# Patient Record
Sex: Male | Born: 1975 | Race: Black or African American | Hispanic: No | Marital: Married | Smoking: Former smoker
Health system: Southern US, Community
[De-identification: ages and names within clinical notes are randomized; demographics above are authoritative.]

## PROBLEM LIST (undated history)

## (undated) DIAGNOSIS — E782 Mixed hyperlipidemia: Secondary | ICD-10-CM

## (undated) DIAGNOSIS — Z720 Tobacco use: Secondary | ICD-10-CM

## (undated) DIAGNOSIS — L729 Follicular cyst of the skin and subcutaneous tissue, unspecified: Secondary | ICD-10-CM

## (undated) DIAGNOSIS — M5412 Radiculopathy, cervical region: Secondary | ICD-10-CM

## (undated) DIAGNOSIS — Z98811 Dental restoration status: Secondary | ICD-10-CM

## (undated) HISTORY — DX: Radiculopathy, cervical region: M54.12

## (undated) HISTORY — PX: KNEE ARTHROSCOPY WITH PATELLAR TENDON REPAIR: SHX5656

## (undated) HISTORY — DX: Tobacco use: Z72.0

## (undated) HISTORY — PX: ACHILLES TENDON REPAIR: SUR1153

---
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
## 1998-03-19 ENCOUNTER — Emergency Department (HOSPITAL_COMMUNITY): Admission: EM | Admit: 1998-03-19 | Discharge: 1998-03-19 | Payer: Self-pay | Admitting: Emergency Medicine

## 2003-04-10 ENCOUNTER — Emergency Department (HOSPITAL_COMMUNITY): Admission: EM | Admit: 2003-04-10 | Discharge: 2003-04-11 | Payer: Self-pay | Admitting: Emergency Medicine

## 2003-04-10 ENCOUNTER — Encounter: Payer: Self-pay | Admitting: Emergency Medicine

## 2005-07-06 ENCOUNTER — Ambulatory Visit (HOSPITAL_COMMUNITY): Admission: RE | Admit: 2005-07-06 | Discharge: 2005-07-06 | Payer: Self-pay | Admitting: *Deleted

## 2008-03-20 ENCOUNTER — Emergency Department (HOSPITAL_COMMUNITY): Admission: EM | Admit: 2008-03-20 | Discharge: 2008-03-20 | Payer: Self-pay | Admitting: Emergency Medicine

## 2008-03-22 ENCOUNTER — Ambulatory Visit (HOSPITAL_COMMUNITY): Admission: RE | Admit: 2008-03-22 | Discharge: 2008-03-22 | Payer: Self-pay | Admitting: Emergency Medicine

## 2010-11-30 ENCOUNTER — Emergency Department (HOSPITAL_BASED_OUTPATIENT_CLINIC_OR_DEPARTMENT_OTHER)
Admission: EM | Admit: 2010-11-30 | Discharge: 2010-11-30 | Disposition: A | Discharge status: Home / Self Care | Payer: Managed Care, Other (non HMO) | Attending: Emergency Medicine | Admitting: Emergency Medicine

## 2010-11-30 ENCOUNTER — Emergency Department (INDEPENDENT_AMBULATORY_CARE_PROVIDER_SITE_OTHER): Payer: Managed Care, Other (non HMO)

## 2010-11-30 DIAGNOSIS — X500XXA Overexertion from strenuous movement or load, initial encounter: Secondary | ICD-10-CM

## 2010-11-30 DIAGNOSIS — F17200 Nicotine dependence, unspecified, uncomplicated: Secondary | ICD-10-CM | POA: Insufficient documentation

## 2010-11-30 DIAGNOSIS — Y9367 Activity, basketball: Secondary | ICD-10-CM

## 2010-11-30 DIAGNOSIS — S86819A Strain of other muscle(s) and tendon(s) at lower leg level, unspecified leg, initial encounter: Secondary | ICD-10-CM

## 2010-11-30 DIAGNOSIS — M66269 Spontaneous rupture of extensor tendons, unspecified lower leg: Secondary | ICD-10-CM | POA: Insufficient documentation

## 2011-07-01 ENCOUNTER — Ambulatory Visit (INDEPENDENT_AMBULATORY_CARE_PROVIDER_SITE_OTHER): Payer: Managed Care, Other (non HMO)

## 2011-07-01 DIAGNOSIS — R5381 Other malaise: Secondary | ICD-10-CM

## 2011-07-01 DIAGNOSIS — J111 Influenza due to unidentified influenza virus with other respiratory manifestations: Secondary | ICD-10-CM

## 2011-07-01 DIAGNOSIS — R05 Cough: Secondary | ICD-10-CM

## 2011-07-01 DIAGNOSIS — R509 Fever, unspecified: Secondary | ICD-10-CM

## 2012-02-06 IMAGING — CR DG KNEE COMPLETE 4+V*R*
4 series · 4 of 4 positions shown · non-contrast
Comparison: None.

CLINICAL DATA: Injury playing basketball

RIGHT KNEE - COMPLETE 4+ VIEW

[t knee ap right]
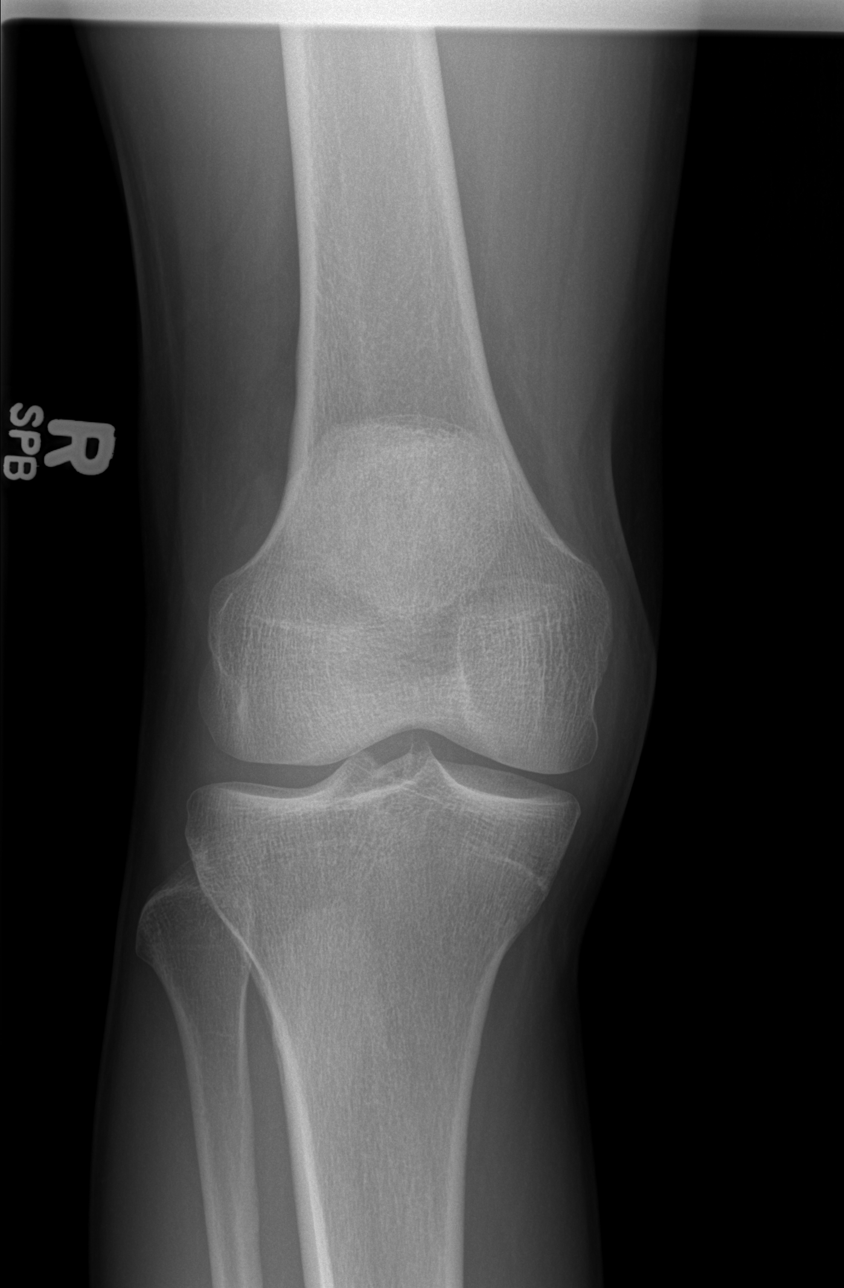

[t knee oblique right (1 of 2)]
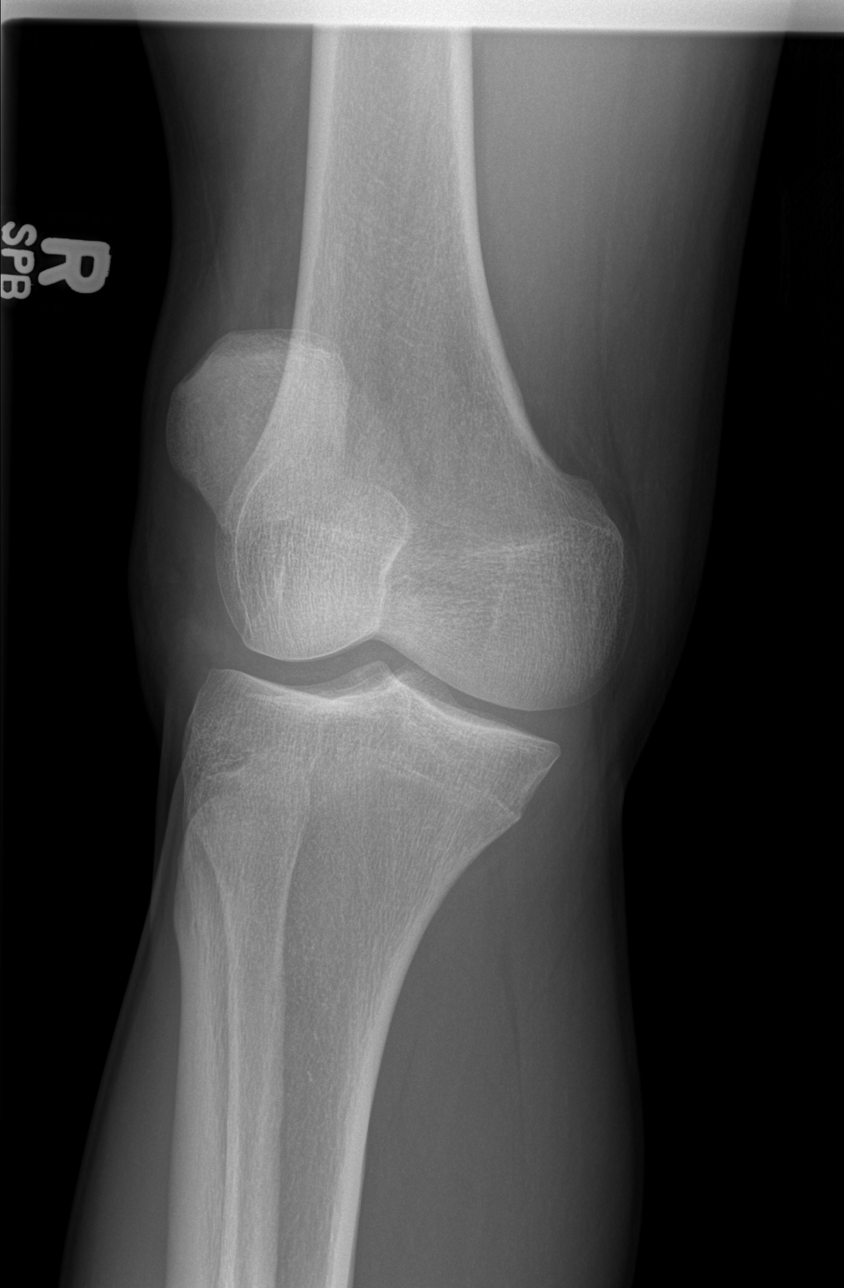

[t knee oblique right (2 of 2)]
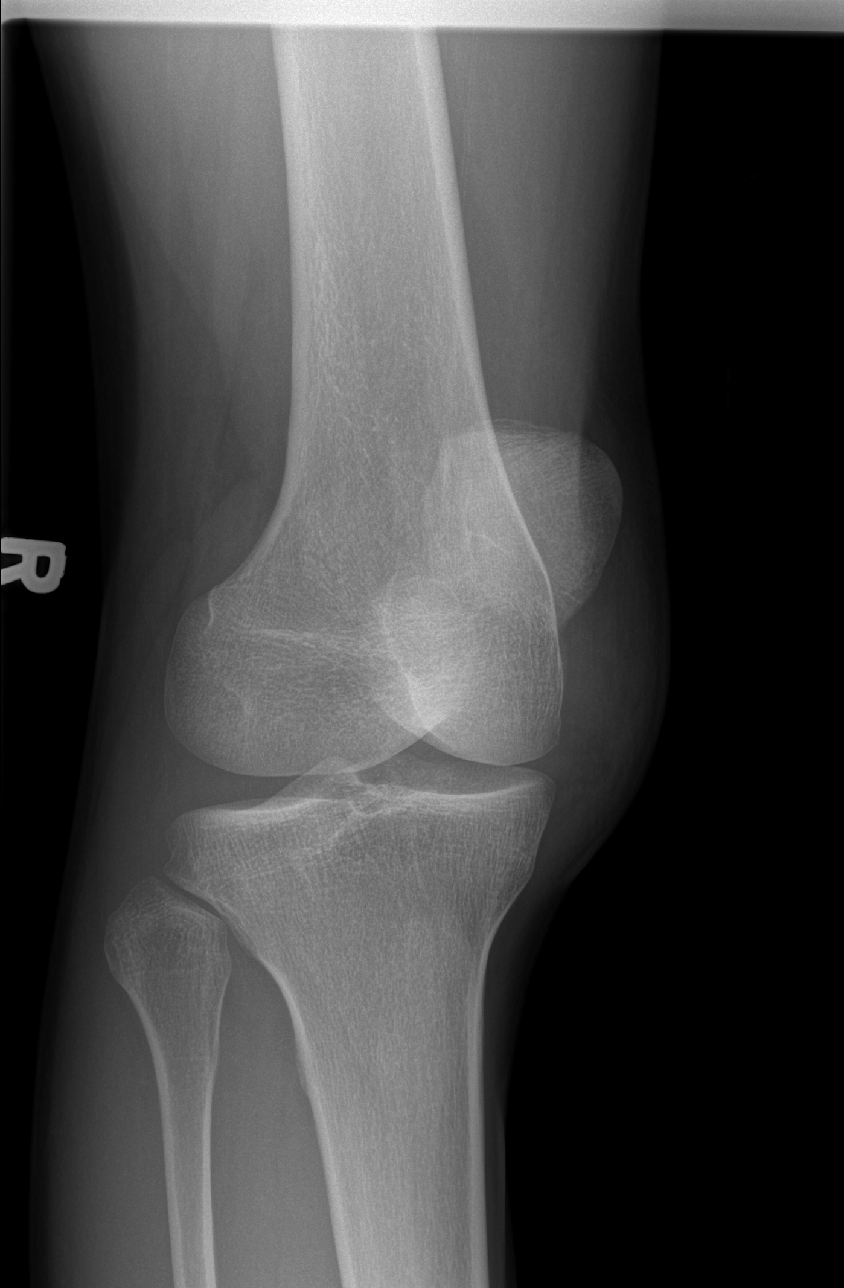

[t knee lat right]
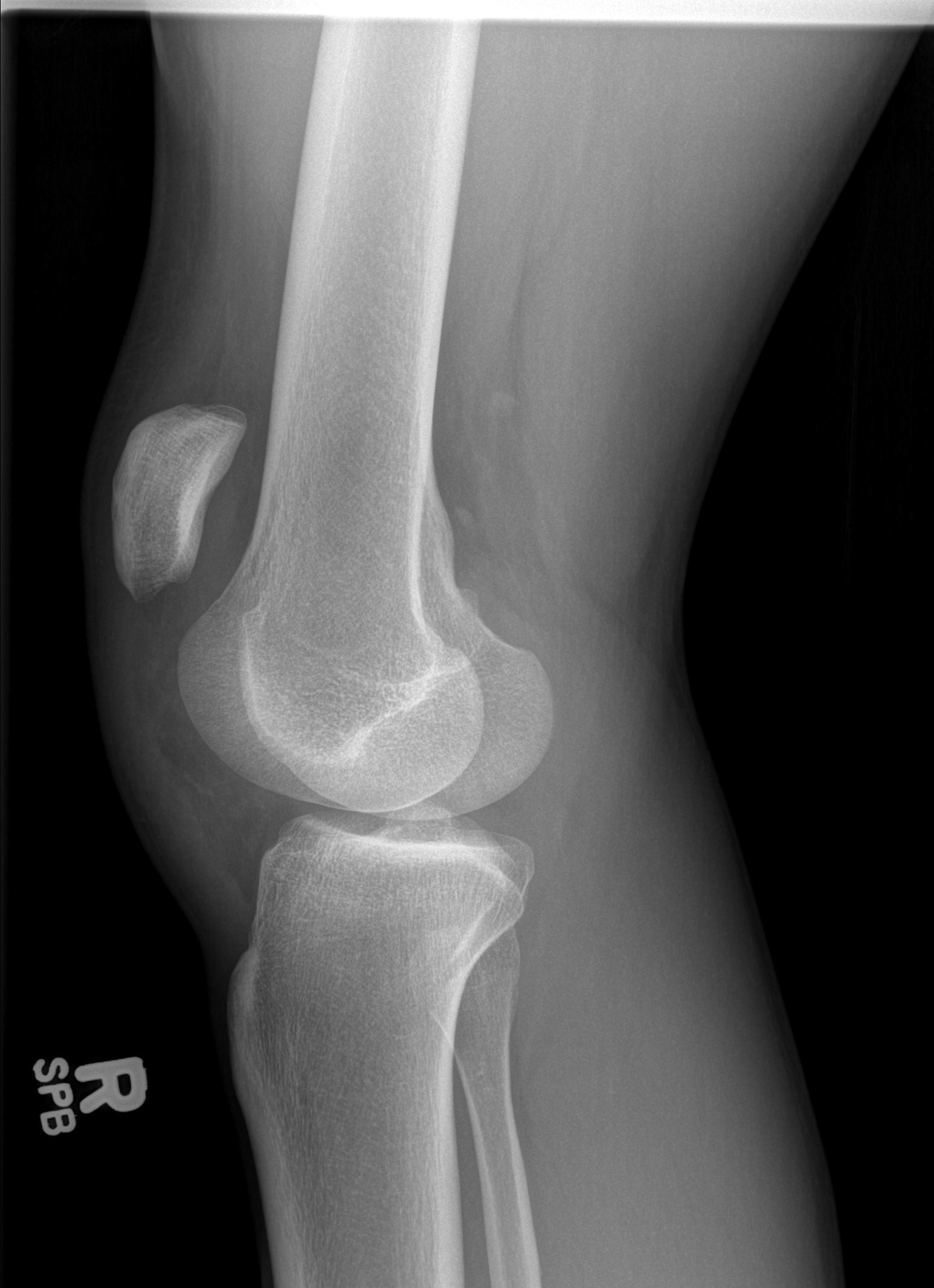

[4 of 4 positions shown; findings below may reference images not displayed]

FINDINGS: High riding patella compatible patella alta.  This
indicates  patellar tendon rupture.

Negative for fracture.
IMPRESSION: Patellar tendon rupture without fracture.

## 2015-12-28 ENCOUNTER — Other Ambulatory Visit: Payer: Self-pay | Admitting: Surgery

## 2016-01-12 DIAGNOSIS — L729 Follicular cyst of the skin and subcutaneous tissue, unspecified: Secondary | ICD-10-CM

## 2016-01-12 HISTORY — DX: Follicular cyst of the skin and subcutaneous tissue, unspecified: L72.9

## 2016-01-18 ENCOUNTER — Encounter (HOSPITAL_BASED_OUTPATIENT_CLINIC_OR_DEPARTMENT_OTHER): Payer: Self-pay | Admitting: *Deleted

## 2016-01-23 NOTE — H&P (Signed)
Derek Brewer 12/28/2015 9:15 AM Location: [facility] Patient #: [id number] DOB: [birth year] Married / Language: English / Race: Black or African American Male   History of Present Illness (Jaydah Stahle A. Ninfa Linden MD; 12/28/2015 9:27 AM) The patient is a 40 year old male who presents with skin lesions. This is a pleasant gentleman referred by Dr. 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 He reports it appeared about a year ago. It is intimately become infected and will spontaneously drain on its own. He recently completed a course of antibiotics for infection in the area. He is now pain-free reports that the area has regressed again. He is otherwise healthy without complaints.   Other Problems Elbert Ewings, CMA; 12/28/2015 9:16 AM) No pertinent past medical history  Past Surgical History Elbert Ewings, CMA; 12/28/2015 9:16 AM) Knee Surgery Right.  Diagnostic Studies History Elbert Ewings, [place]; 12/28/2015 9:16 AM) Colonoscopy never  Allergies Elbert Ewings, CMA; 12/28/2015 9:16 AM) No Known Drug Allergies06/16/2017  Medication History Elbert Ewings, CMA; 12/28/2015 9:16 AM) Multiple Vitamin (Oral) Active. Medications Reconciled  Social History Elbert Ewings, [place]; 12/28/2015 9:16 AM) Alcohol use Occasional alcohol use. Caffeine use Carbonated beverages, Coffee, Tea. No drug use Tobacco use Former smoker.  Family History Elbert Ewings, [place]; 12/28/2015 9:16 AM) Family history unknown First Degree Relatives    Review of Systems Elbert Ewings CMA; 12/28/2015 9:16 AM) General Not Present- Appetite Loss, Chills, Fatigue, Fever, Night Sweats, Weight Gain and Weight Loss. Skin Not Present- Change in Wart/Mole, Dryness, Hives, Jaundice, New Lesions, Non-Healing Wounds, Rash and Ulcer. HEENT Present- Seasonal Allergies. Not Present- Earache, Hearing Loss, Hoarseness, Nose Bleed, Oral Ulcers, Ringing in the Ears, Sinus Pain, Sore  Throat, Visual Disturbances, Wears glasses/contact lenses and Yellow Eyes. Respiratory Not Present- Bloody sputum, Chronic Cough, Difficulty Breathing, Snoring and Wheezing. Breast Not Present- Breast Mass, Breast Pain, Nipple Discharge and Skin Changes. Cardiovascular Not Present- Chest Pain, Difficulty Breathing Lying Down, Leg Cramps, Palpitations, Rapid Heart Rate, Shortness of Breath and Swelling of Extremities. Gastrointestinal Not Present- Abdominal Pain, Bloating, Bloody Stool, Change in Bowel Habits, Chronic diarrhea, Constipation, Difficulty Swallowing, Excessive gas, Gets full quickly at meals, Hemorrhoids, Indigestion, Nausea, Rectal Pain and Vomiting. Male Genitourinary Not Present- Blood in Urine, Change in Urinary Stream, Frequency, Impotence, Nocturia, Painful Urination, Urgency and Urine Leakage. Musculoskeletal Not Present- Back Pain, Joint Pain, Joint Stiffness, Muscle Pain, Muscle Weakness and Swelling of Extremities. Neurological Not Present- Decreased Memory, Fainting, Headaches, Numbness, Seizures, Tingling, Tremor, Trouble walking and Weakness. Psychiatric Not Present- Anxiety, Bipolar, Change in Sleep Pattern, Depression, Fearful and Frequent crying. Endocrine Not Present- Cold Intolerance, Excessive Hunger, Hair Changes, Heat Intolerance, Hot flashes and New Diabetes. Hematology Not Present- Easy Bruising, Excessive bleeding, Gland problems, HIV and Persistent Infections.  Vitals Elbert Ewings CMA; 12/28/2015 9:17 AM) 12/28/2015 9:16 AM Weight: 175 lb Height: 71in Body Surface Area: 1.99 m Body Mass Index: 24.41 kg/m  Temp.: 98.29F(Temporal)  Pulse: 88 (Regular)  BP: 140/72 (Sitting, Left Arm, Standard)   Physical Exam (Dayon Witt A. Ninfa Linden MD; 12/28/2015 9:28 AM) The physical exam findings are as follows: Note:On exam he is well-appearing Lungs are clear bilaterally Cardiovascular is regular rate and rhythm There is a 1 cm cyst on the left buttock  lateral to the gluteal cleft. It is currently nontender.    Assessment & Plan (Enijah Furr A. Ninfa Linden MD; 12/28/2015 9:29 AM) INFECTED SEBACEOUS CYST (L72.3) Impression: I explained the diagnosis to him in detail. Excision of this area  is recommended to both prevent continued infections and to rule out malignancy. I discussed the surgical procedure with him in detail. I discussed the risk of surgery which includes but is not limited to bleeding, infection, recurrence, etc. I will send more antibiotics to his pharmacy just in case he develops another infection prior to surgical excision. He agrees to proceed with surgery    Signed by Harl Bowie, MD (12/28/2015 9:29 AM)

## 2016-01-24 ENCOUNTER — Encounter (HOSPITAL_BASED_OUTPATIENT_CLINIC_OR_DEPARTMENT_OTHER)
Admission: RE | Disposition: A | Discharge status: Home / Self Care | Payer: Self-pay | Source: Ambulatory Visit | Attending: Surgery

## 2016-01-24 ENCOUNTER — Ambulatory Visit (HOSPITAL_BASED_OUTPATIENT_CLINIC_OR_DEPARTMENT_OTHER): Payer: Managed Care, Other (non HMO) | Admitting: Certified Registered"

## 2016-01-24 ENCOUNTER — Encounter (HOSPITAL_BASED_OUTPATIENT_CLINIC_OR_DEPARTMENT_OTHER): Payer: Self-pay | Admitting: Certified Registered"

## 2016-01-24 ENCOUNTER — Ambulatory Visit (HOSPITAL_BASED_OUTPATIENT_CLINIC_OR_DEPARTMENT_OTHER)
Admission: RE | Admit: 2016-01-24 | Discharge: 2016-01-24 | Disposition: A | Discharge status: Home / Self Care | Payer: Managed Care, Other (non HMO) | Source: Ambulatory Visit | Attending: Surgery | Admitting: Surgery

## 2016-01-24 DIAGNOSIS — D367 Benign neoplasm of other specified sites: Secondary | ICD-10-CM | POA: Diagnosis present

## 2016-01-24 DIAGNOSIS — Z87891 Personal history of nicotine dependence: Secondary | ICD-10-CM | POA: Insufficient documentation

## 2016-01-24 HISTORY — PX: CYST EXCISION: SHX5701

## 2016-01-24 HISTORY — DX: Follicular cyst of the skin and subcutaneous tissue, unspecified: L72.9

## 2016-01-24 HISTORY — DX: Dental restoration status: Z98.811

## 2016-01-24 SURGERY — CYST REMOVAL
Anesthesia: Monitor Anesthesia Care | Site: Buttocks | Laterality: Left

## 2016-01-24 MED ORDER — PROPOFOL 500 MG/50ML IV EMUL
INTRAVENOUS | Status: DC | PRN
  Administered 2016-01-24: 30 ug via INTRAVENOUS
  Administered 2016-01-24: 20 ug via INTRAVENOUS
  Administered 2016-01-24: 10 ug via INTRAVENOUS
  Administered 2016-01-24: 20 ug via INTRAVENOUS

## 2016-01-24 MED ORDER — OXYCODONE HCL 5 MG PO TABS: 5.0000 mg | ORAL_TABLET | Freq: Once | ORAL | Status: DC | PRN

## 2016-01-24 MED ORDER — FENTANYL CITRATE (PF) 100 MCG/2ML IJ SOLN: 25.0000 ug | INTRAMUSCULAR | Status: DC | PRN

## 2016-01-24 MED ORDER — CEFAZOLIN SODIUM-DEXTROSE 2-4 GM/100ML-% IV SOLN
2.0000 g | INTRAVENOUS | Status: AC
  Administered 2016-01-24: 2 g via INTRAVENOUS

## 2016-01-24 MED ORDER — SODIUM BICARBONATE 4 % IV SOLN
INTRAVENOUS | Status: AC
  Filled 2016-01-24: qty 5

## 2016-01-24 MED ORDER — ONDANSETRON HCL 4 MG/2ML IJ SOLN
INTRAMUSCULAR | Status: AC
  Filled 2016-01-24: qty 2

## 2016-01-24 MED ORDER — PROPOFOL 500 MG/50ML IV EMUL
INTRAVENOUS | Status: AC
  Filled 2016-01-24: qty 50

## 2016-01-24 MED ORDER — LIDOCAINE HCL (PF) 1 % IJ SOLN
INTRAMUSCULAR | Status: DC | PRN
  Administered 2016-01-24: 20 mL

## 2016-01-24 MED ORDER — MIDAZOLAM HCL 2 MG/2ML IJ SOLN
1.0000 mg | INTRAMUSCULAR | Status: DC | PRN
Start: 2016-01-24 — End: 2016-01-24
  Administered 2016-01-24: 2 mg via INTRAVENOUS

## 2016-01-24 MED ORDER — OXYCODONE-ACETAMINOPHEN 5-325 MG PO TABS: 1.0000 | ORAL_TABLET | ORAL | Status: DC | PRN

## 2016-01-24 MED ORDER — OXYCODONE HCL 5 MG/5ML PO SOLN: 5.0000 mg | Freq: Once | ORAL | Status: DC | PRN

## 2016-01-24 MED ORDER — ONDANSETRON HCL 4 MG/2ML IJ SOLN
INTRAMUSCULAR | Status: DC | PRN
  Administered 2016-01-24: 4 mg via INTRAVENOUS

## 2016-01-24 MED ORDER — FENTANYL CITRATE (PF) 100 MCG/2ML IJ SOLN
50.0000 ug | INTRAMUSCULAR | Status: DC | PRN
  Administered 2016-01-24 (×2): 50 ug via INTRAVENOUS

## 2016-01-24 MED ORDER — GLYCOPYRROLATE 0.2 MG/ML IJ SOLN: 0.2000 mg | Freq: Once | INTRAMUSCULAR | Status: DC | PRN

## 2016-01-24 MED ORDER — SCOPOLAMINE 1 MG/3DAYS TD PT72: 1.0000 | MEDICATED_PATCH | Freq: Once | TRANSDERMAL | Status: DC | PRN

## 2016-01-24 MED ORDER — ONDANSETRON HCL 4 MG/2ML IJ SOLN: 4.0000 mg | Freq: Four times a day (QID) | INTRAMUSCULAR | Status: DC | PRN

## 2016-01-24 MED ORDER — LIDOCAINE HCL (PF) 1 % IJ SOLN
INTRAMUSCULAR | Status: AC
  Filled 2016-01-24: qty 30

## 2016-01-24 MED ORDER — MIDAZOLAM HCL 2 MG/2ML IJ SOLN
INTRAMUSCULAR | Status: AC
  Filled 2016-01-24: qty 2

## 2016-01-24 MED ORDER — LIDOCAINE HCL (CARDIAC) 20 MG/ML IV SOLN
INTRAVENOUS | Status: DC | PRN
  Administered 2016-01-24: 20 mg via INTRAVENOUS

## 2016-01-24 MED ORDER — LIDOCAINE 2% (20 MG/ML) 5 ML SYRINGE
INTRAMUSCULAR | Status: AC
  Filled 2016-01-24: qty 5

## 2016-01-24 MED ORDER — FENTANYL CITRATE (PF) 100 MCG/2ML IJ SOLN
INTRAMUSCULAR | Status: AC
  Filled 2016-01-24: qty 2

## 2016-01-24 MED ORDER — BUPIVACAINE-EPINEPHRINE (PF) 0.5% -1:200000 IJ SOLN
INTRAMUSCULAR | Status: AC
  Filled 2016-01-24: qty 30

## 2016-01-24 MED ORDER — CEFAZOLIN SODIUM-DEXTROSE 2-4 GM/100ML-% IV SOLN
INTRAVENOUS | Status: AC
  Filled 2016-01-24: qty 100

## 2016-01-24 MED ORDER — LACTATED RINGERS IV SOLN
INTRAVENOUS | Status: DC
  Administered 2016-01-24: 10:00:00 via INTRAVENOUS

## 2016-01-24 SURGICAL SUPPLY — 40 items
BLADE HEX COATED 2.75 (ELECTRODE) ×3 IMPLANT
BLADE SURG 15 STRL LF DISP TIS (BLADE) ×1 IMPLANT
BLADE SURG 15 STRL SS (BLADE) ×2
CANISTER SUCT 1200ML W/VALVE (MISCELLANEOUS) IMPLANT
CHLORAPREP W/TINT 26ML (MISCELLANEOUS) IMPLANT
COVER BACK TABLE 60X90IN (DRAPES) ×3 IMPLANT
COVER MAYO STAND STRL (DRAPES) ×3 IMPLANT
DECANTER SPIKE VIAL GLASS SM (MISCELLANEOUS) IMPLANT
DRAPE LAPAROTOMY 100X72 PEDS (DRAPES) ×3 IMPLANT
DRAPE UTILITY XL STRL (DRAPES) ×3 IMPLANT
ELECT REM PT RETURN 9FT ADLT (ELECTROSURGICAL) ×3
ELECTRODE REM PT RTRN 9FT ADLT (ELECTROSURGICAL) ×1 IMPLANT
GLOVE BIO SURGEON STRL SZ7 (GLOVE) ×3 IMPLANT
GLOVE BIOGEL PI IND STRL 7.5 (GLOVE) ×1 IMPLANT
GLOVE BIOGEL PI INDICATOR 7.5 (GLOVE) ×2
GLOVE EXAM NITRILE EXT CUFF MD (GLOVE) ×3 IMPLANT
GLOVE SURG SIGNA 7.5 PF LTX (GLOVE) ×3 IMPLANT
GLOVE SURG SS PI 7.5 STRL IVOR (GLOVE) ×3 IMPLANT
GOWN STRL REUS W/ TWL LRG LVL3 (GOWN DISPOSABLE) ×1 IMPLANT
GOWN STRL REUS W/ TWL XL LVL3 (GOWN DISPOSABLE) ×2 IMPLANT
GOWN STRL REUS W/TWL LRG LVL3 (GOWN DISPOSABLE) ×2
GOWN STRL REUS W/TWL XL LVL3 (GOWN DISPOSABLE) ×4
LIQUID BAND (GAUZE/BANDAGES/DRESSINGS) ×3 IMPLANT
NEEDLE HYPO 25X1 1.5 SAFETY (NEEDLE) ×3 IMPLANT
NS IRRIG 1000ML POUR BTL (IV SOLUTION) IMPLANT
PACK BASIN DAY SURGERY FS (CUSTOM PROCEDURE TRAY) ×3 IMPLANT
PENCIL BUTTON HOLSTER BLD 10FT (ELECTRODE) ×3 IMPLANT
SLEEVE SCD COMPRESS KNEE MED (MISCELLANEOUS) IMPLANT
SPONGE GAUZE 4X4 12PLY STER LF (GAUZE/BANDAGES/DRESSINGS) ×3 IMPLANT
SPONGE LAP 4X18 X RAY DECT (DISPOSABLE) ×3 IMPLANT
SUT MNCRL AB 4-0 PS2 18 (SUTURE) ×3 IMPLANT
SUT VIC AB 3-0 SH 27 (SUTURE) ×2
SUT VIC AB 3-0 SH 27X BRD (SUTURE) ×1 IMPLANT
SYR CONTROL 10ML LL (SYRINGE) ×3 IMPLANT
TOWEL OR 17X24 6PK STRL BLUE (TOWEL DISPOSABLE) ×3 IMPLANT
TOWEL OR NON WOVEN STRL DISP B (DISPOSABLE) IMPLANT
TRAY DSU PREP LF (CUSTOM PROCEDURE TRAY) ×3 IMPLANT
TUBE CONNECTING 20'X1/4 (TUBING)
TUBE CONNECTING 20X1/4 (TUBING) IMPLANT
YANKAUER SUCT BULB TIP NO VENT (SUCTIONS) IMPLANT

## 2016-01-24 NOTE — Anesthesia Procedure Notes (Signed)
Procedure Name: MAC Date/Time: 01/24/2016 11:19 AM Performed by: Baxter Flattery Pre-anesthesia Checklist: Patient identified, Emergency Drugs available, Suction available and Patient being monitored Patient Re-evaluated:Patient Re-evaluated prior to inductionOxygen Delivery Method: Nasal cannula Preoxygenation: Pre-oxygenation with 100% oxygen Intubation Type: IV induction Dental Injury: Teeth and Oropharynx as per pre-operative assessment

## 2016-01-24 NOTE — Op Note (Signed)
Derek Brewer, Derek Brewer               ACCOUNT NO.:  [id number]  MEDICAL RECORD NO.:  [id number]  LOCATION:                                 FACILITY:  PHYSICIAN:  Coralie Keens, M.D. DATE OF BIRTH:  [birth year]  DATE OF PROCEDURE:  01/24/2016 DATE OF DISCHARGE:                              OPERATIVE REPORT   PREOPERATIVE DIAGNOSIS:  Left buttock chronically infected cyst.  POSTOPERATIVE DIAGNOSIS:  Left buttock chronically infected cyst.  PROCEDURE:  Excision of 1 cm left buttock cyst.  SURGEON:  Coralie Keens, M.D.  ANESTHESIA:  Lidocaine 1% with monitored anesthesia care.  ESTIMATED BLOOD LOSS:  Minimal.  INDICATIONS:  This is a 40 year old gentleman who has had a recurrent infected cyst on his left buttock which has required incision and drainage in the past.  He now presents for formal excision of this area to prevent ongoing infections.  PROCEDURE IN DETAIL:  The patient was brought to the operating room, identified as Paulla Fore.  He was placed prone on the operating table and anesthesia was induced.  His buttocks were then prepped and draped in usual sterile fashion.  A small 1 cm cyst on the left buttock at the gluteal cleft.  I anesthetized the area with lidocaine.  I then performed an elliptical incision with a scalpel.  I then excised the cyst and surrounding skin in its entirety with electrocautery.  The cyst again was approximately 1 cm in size.  I then achieved hemostasis with cautery.  I anesthetized the wound further with lidocaine.  I then closed the subcutaneous tissue with interrupted 3-0 Vicryl sutures.  I closed the skin with a running 4-0 Monocryl.  Skin glue was then applied.  The patient tolerated the procedure well.  All countswere correct at the end of procedure.  The patient was then awakened in the operating room and taken in a stable condition to the recovery room.     Coralie Keens, M.D.   ______________________________ Coralie Keens, M.D.   DB/MEDQ  D:  01/24/2016  T:  01/24/2016  Job:  [id number]

## 2016-01-24 NOTE — Anesthesia Postprocedure Evaluation (Signed)
Anesthesia Post Note  Patient: Derek Brewer  Procedure(s) Performed: Procedure(s) (LRB): EXCISION LEFT BUTTOCK CYST (Left)  Patient location during evaluation: PACU Anesthesia Type: MAC Level of consciousness: awake and alert Pain management: pain level controlled Vital Signs Assessment: post-procedure vital signs reviewed and stable Respiratory status: spontaneous breathing, nonlabored ventilation, respiratory function stable and patient connected to nasal cannula oxygen Cardiovascular status: stable and blood pressure returned to baseline Anesthetic complications: no    Last Vitals:  Filed Vitals:   01/24/16 1149 01/24/16 1200  BP:  117/81  Pulse: 62 67  Temp:    Resp: 7 15    Last Pain:  Filed Vitals:   01/24/16 1202  PainSc: 0-No pain                 Tita Terhaar S

## 2016-01-24 NOTE — Anesthesia Preprocedure Evaluation (Signed)
Anesthesia Evaluation  Patient identified by MRN, date of birth, ID band Patient awake    Reviewed: Allergy & Precautions, H&P , NPO status , Patient's Chart, lab work & pertinent test results  Airway Mallampati: II   Neck ROM: full    Dental   Pulmonary former smoker,    breath sounds clear to auscultation       Cardiovascular negative cardio ROS   Rhythm:regular Rate:Normal     Neuro/Psych    GI/Hepatic   Endo/Other    Renal/GU      Musculoskeletal   Abdominal   Peds  Hematology   Anesthesia Other Findings   Reproductive/Obstetrics                             Anesthesia Physical Anesthesia Plan  ASA: I  Anesthesia Plan: MAC   Post-op Pain Management:    Induction: Intravenous  Airway Management Planned: Simple Face Mask  Additional Equipment:   Intra-op Plan:   Post-operative Plan:   Informed Consent: I have reviewed the patients History and Physical, chart, labs and discussed the procedure including the risks, benefits and alternatives for the proposed anesthesia with the patient or authorized representative who has indicated his/her understanding and acceptance.     Plan Discussed with: CRNA, Anesthesiologist and Surgeon  Anesthesia Plan Comments:         Anesthesia Quick Evaluation

## 2016-01-24 NOTE — Transfer of Care (Signed)
Immediate Anesthesia Transfer of Care Note  Patient: Derek Brewer  Procedure(s) Performed: Procedure(s): EXCISION LEFT BUTTOCK CYST (Left)  Patient Location: PACU  Anesthesia Type:MAC  Level of Consciousness: awake, alert , oriented and patient cooperative  Airway & Oxygen Therapy: Patient Spontanous Breathing and Patient connected to face mask oxygen  Post-op Assessment: Report given to RN, Post -op Vital signs reviewed and stable and Patient moving all extremities  Post vital signs: Reviewed and stable  Last Vitals:  Filed Vitals:   01/24/16 1148 01/24/16 1149  BP:    Pulse: 61 62  Temp:    Resp:  7    Last Pain: There were no vitals filed for this visit.       Complications: No apparent anesthesia complications

## 2016-01-24 NOTE — Discharge Instructions (Signed)
Ok to shower tomorrow  No soaking in tub or swimming for one week  No vigorous activity for one week    Post Anesthesia Home Care Instructions  Activity: Get plenty of rest for the remainder of the day. A responsible adult should stay with you for 24 hours following the procedure.  For the next 24 hours, DO NOT: -Drive a car -Paediatric nurse -Drink alcoholic beverages -Take any medication unless instructed by your physician -Make any legal decisions or sign important papers.  Meals: Start with liquid foods such as gelatin or soup. Progress to regular foods as tolerated. Avoid greasy, spicy, heavy foods. If nausea and/or vomiting occur, drink only clear liquids until the nausea and/or vomiting subsides. Call your physician if vomiting continues.  Special Instructions/Symptoms: Your throat may feel dry or sore from the anesthesia or the breathing tube placed in your throat during surgery. If this causes discomfort, gargle with warm salt water. The discomfort should disappear within 24 hours.  If you had a scopolamine patch placed behind your ear for the management of post- operative nausea and/or vomiting:  1. The medication in the patch is effective for 72 hours, after which it should be removed.  Wrap patch in a tissue and discard in the trash. Wash hands thoroughly with soap and water. 2. You may remove the patch earlier than 72 hours if you experience unpleasant side effects which may include dry mouth, dizziness or visual disturbances. 3. Avoid touching the patch. Wash your hands with soap and water after contact with the patch.

## 2016-01-24 NOTE — Op Note (Signed)
EXCISION LEFT BUTTOCK CYST  Procedure Note  DELMONTE YERK 01/24/2016   Pre-op Diagnosis: chronically infected left buttock cyst     Post-op Diagnosis: same  Procedure(s): EXCISION LEFT BUTTOCK CYST ( 1 cm )  Surgeon(s): Coralie Keens, MD  Anesthesia: Monitor Anesthesia Care  Staff:  Circulator: Ted Mcalpine, RN Scrub Person: Antionette Poles, RN  Estimated Blood Loss: Minimal               Specimens: sent to path          [facility] A   Date: 01/24/2016  Time: 11:45 AM

## 2016-01-24 NOTE — Interval H&P Note (Signed)
History and Physical Interval Note: no change in H and P  01/24/2016 10:58 AM  Clemetine Marker  has presented today for surgery, with the diagnosis of chronically infected left buttock cyst  The various methods of treatment have been discussed with the patient and family. After consideration of risks, benefits and other options for treatment, the patient has consented to  Procedure(s): EXCISION LEFT BUTTOCK CYST (Left) as a surgical intervention .  The patient's history has been reviewed, patient examined, no change in status, stable for surgery.  I have reviewed the patient's chart and labs.  Questions were answered to the patient's satisfaction.     Shafter Jupin A

## 2016-01-27 ENCOUNTER — Encounter (HOSPITAL_BASED_OUTPATIENT_CLINIC_OR_DEPARTMENT_OTHER): Payer: Self-pay | Admitting: Surgery

## 2018-02-16 ENCOUNTER — Other Ambulatory Visit: Payer: Self-pay

## 2018-02-16 ENCOUNTER — Emergency Department
Admission: EM | Admit: 2018-02-16 | Discharge: 2018-02-16 | Disposition: A | Discharge status: Home / Self Care | Payer: 59 | Source: Home / Self Care | Attending: Family Medicine | Admitting: Family Medicine

## 2018-02-16 ENCOUNTER — Encounter: Payer: Self-pay | Admitting: Emergency Medicine

## 2018-02-16 DIAGNOSIS — M5412 Radiculopathy, cervical region: Secondary | ICD-10-CM

## 2018-02-16 DIAGNOSIS — M62838 Other muscle spasm: Secondary | ICD-10-CM

## 2018-02-16 MED ORDER — METHYLPREDNISOLONE ACETATE 80 MG/ML IJ SUSP
80.0000 mg | Freq: Once | INTRAMUSCULAR | Status: AC
  Administered 2018-02-16: 80 mg via INTRAMUSCULAR

## 2018-02-16 MED ORDER — MELOXICAM 15 MG PO TABS: 15.0000 mg | ORAL_TABLET | Freq: Every day | ORAL | 0 refills | Status: DC

## 2018-02-16 MED ORDER — CYCLOBENZAPRINE HCL 5 MG PO TABS: 5.0000 mg | ORAL_TABLET | Freq: Two times a day (BID) | ORAL | 0 refills | Status: DC | PRN

## 2018-02-16 NOTE — ED Triage Notes (Signed)
Patient works out at gym and 6 days ago noticed pain in right side of neck and shoulder; positions make it worse; has had this happen before but usually clears faster. Has tried ice, heat, ibuprofen and tylenol without relief.

## 2018-02-16 NOTE — ED Provider Notes (Signed)
[facility]    CSN: [id number] Arrival date & time: 02/16/18  0803     History   Chief Complaint Chief Complaint  Patient presents with  . Neck Pain    HPI Derek Brewer is a 42 y.o. male.   HPI Derek Brewer is a 43 y.o. male presenting to UC with c/o Right sided neck pain that started about 6 days ago after working out at [employer]. He normally works out and does not recall an injury but does not recall any other activity that may have caused his neck pain.  Pain is aching and sore, worse with certain head movements. Moderate in severity. Temporary relief with ice, heat, ibuprofen and acetaminophen. Occasional radiation of pain and numbness into his Right upper arm.    Past Medical History:  Diagnosis Date  . Cyst of buttocks 01/2016   left buttock   . Dental crowns present    x 2    There are no active problems to display for this patient.   Past Surgical History:  Procedure Laterality Date  . ACHILLES TENDON REPAIR Left   . CYST EXCISION Left 01/24/2016   Procedure: EXCISION LEFT BUTTOCK CYST;  Surgeon: Coralie Keens, MD;  Location: [place];  Service: General;  Laterality: Left;  . KNEE ARTHROSCOPY WITH PATELLAR TENDON REPAIR Right        Home Medications    Prior to Admission medications   Medication Sig Start Date End Date Taking? Authorizing Provider  cyclobenzaprine (FLEXERIL) 5 MG tablet Take 1-2 tablets (5-10 mg total) by mouth 2 (two) times daily as needed for muscle spasms. 02/16/18   Noe Gens, PA-C  meloxicam (MOBIC) 15 MG tablet Take 1 tablet (15 mg total) by mouth daily. For 5 days, then daily as needed for pain 02/16/18   Noe Gens, PA-C  Multiple Vitamin (MULTIVITAMIN) tablet Take 1 tablet by mouth daily.    [provider]  oxyCODONE-acetaminophen (ROXICET) 5-325 MG tablet Take 1-2 tablets by mouth every 4 (four) hours as needed for moderate pain. 01/24/16   Coralie Keens, MD    Family  History History reviewed. No pertinent family history.  Social History Social History   Tobacco Use  . Smoking status: Former Smoker    Last attempt to quit: 01/11/2015    Years since quitting: 3.1  . Smokeless tobacco: Never Used  . Tobacco comment: occasional vape smoker  Substance Use Topics  . Alcohol use: Yes    Comment: rarely  . Drug use: No     Allergies   Patient has no known allergies.   Review of Systems Review of Systems  Musculoskeletal: Positive for back pain, myalgias, neck pain and neck stiffness.  Skin: Negative for color change and rash.  Neurological: Positive for numbness. Negative for weakness.     Physical Exam Triage Vital Signs ED Triage Vitals [02/16/18 0840]  Enc Vitals Group     BP 124/81     Pulse Rate 63     Resp 16     Temp 98.1 F (36.7 C)     Temp Source Oral     SpO2 100 %     Weight 170 lb (77.1 kg)     Height 5\' 11"  (1.803 m)     Head Circumference      Peak Flow      Pain Score 7     Pain Loc      Pain Edu?  Excl. in [place]?    No data found.  Updated Vital Signs BP 124/81 (BP Location: Right Arm)   Pulse 63   Temp 98.1 F (36.7 C) (Oral)   Resp 16   Ht 5\' 11"  (1.803 m)   Wt 170 lb (77.1 kg)   SpO2 100%   BMI 23.71 kg/m   Visual Acuity Right Eye Distance:   Left Eye Distance:   Bilateral Distance:    Right Eye Near:   Left Eye Near:    Bilateral Near:     Physical Exam  Constitutional: He is oriented to person, place, and time. He appears well-developed and well-nourished.  HENT:  Head: Normocephalic and atraumatic.  Eyes: EOM are normal.  Neck: Normal range of motion. Neck supple.  No midline spinal tenderness. Full ROM. No neck tenderness.  Cardiovascular: Normal rate.  Pulses:      Radial pulses are 2+ on the right side.  Pulmonary/Chest: Effort normal.  Musculoskeletal: Normal range of motion. He exhibits edema and tenderness.       Back:  No spinal tenderness. Tenderness to Right upper  trapezius. Full ROM Right arm. 5/5 strength in Right arm   Neurological: He is alert and oriented to person, place, and time.  Skin: Skin is warm and dry. Capillary refill takes less than 2 seconds.  Psychiatric: He has a normal mood and affect. His behavior is normal.  Nursing note and vitals reviewed.    UC Treatments / Results  Labs (all labs ordered are listed, but only abnormal results are displayed) Labs Reviewed - No data to display  EKG None  Radiology No results found.  Procedures Procedures (including critical care time)  Medications Ordered in UC Medications  methylPREDNISolone acetate (DEPO-MEDROL) injection 80 mg (80 mg Intramuscular Given 02/16/18 0908)    Initial Impression / Assessment and Plan / UC Course  I have reviewed the triage vital signs and the nursing notes.  Pertinent labs & imaging results that were available during my care of the patient were reviewed by me and considered in my medical decision making (see chart for details).     Hx and exam c/w muscle spasms and cervical radiculopathy Will tx symptomatically. No indication for urgent imaging at this time.   Final Clinical Impressions(s) / UC Diagnoses   Final diagnoses:  Muscle spasms of neck  Trapezius muscle spasm  Right cervical radiculopathy     Discharge Instructions      Flexeril (cyclobenzaprine) is a muscle relaxer and may cause drowsiness. Do not drink alcohol, drive, or operate heavy machinery while taking.  Meloxicam (Mobic) is an antiinflammatory to help with pain and inflammation.  Do not take ibuprofen, Advil, Aleve, or any other medications that contain NSAIDs while taking meloxicam as this may cause stomach upset or even ulcers if taken in large amounts for an extended period of time.   Please follow up with Family Medicine or Sports Medicine in 1-2 weeks if not improving.     ED Prescriptions    Medication Sig Dispense Auth. Provider   meloxicam (MOBIC) 15 MG  tablet Take 1 tablet (15 mg total) by mouth daily. For 5 days, then daily as needed for pain 20 tablet Gerarda Fraction, Jeran Hiltz O, PA-C   cyclobenzaprine (FLEXERIL) 5 MG tablet Take 1-2 tablets (5-10 mg total) by mouth 2 (two) times daily as needed for muscle spasms. 30 tablet Noe Gens, PA-C     Controlled Substance Prescriptions [place] Controlled Substance Registry consulted? Not Applicable  Noe Gens, [place] 02/16/18 1217

## 2018-02-16 NOTE — Discharge Instructions (Signed)
°  Flexeril (cyclobenzaprine) is a muscle relaxer and may cause drowsiness. Do not drink alcohol, drive, or operate heavy machinery while taking.  Meloxicam (Mobic) is an antiinflammatory to help with pain and inflammation.  Do not take ibuprofen, Advil, Aleve, or any other medications that contain NSAIDs while taking meloxicam as this may cause stomach upset or even ulcers if taken in large amounts for an extended period of time.   Please follow up with Family Medicine or Sports Medicine in 1-2 weeks if not improving.

## 2018-03-15 ENCOUNTER — Emergency Department (INDEPENDENT_AMBULATORY_CARE_PROVIDER_SITE_OTHER): Payer: 59

## 2018-03-15 ENCOUNTER — Emergency Department
Admission: EM | Admit: 2018-03-15 | Discharge: 2018-03-15 | Disposition: A | Discharge status: Home / Self Care | Payer: 59 | Source: Home / Self Care | Attending: Family Medicine | Admitting: Family Medicine

## 2018-03-15 ENCOUNTER — Other Ambulatory Visit: Payer: Self-pay

## 2018-03-15 ENCOUNTER — Encounter: Payer: Self-pay | Admitting: Emergency Medicine

## 2018-03-15 DIAGNOSIS — M5412 Radiculopathy, cervical region: Secondary | ICD-10-CM

## 2018-03-15 DIAGNOSIS — M50321 Other cervical disc degeneration at C4-C5 level: Secondary | ICD-10-CM | POA: Diagnosis not present

## 2018-03-15 DIAGNOSIS — M5030 Other cervical disc degeneration, unspecified cervical region: Secondary | ICD-10-CM | POA: Diagnosis not present

## 2018-03-15 DIAGNOSIS — M542 Cervicalgia: Secondary | ICD-10-CM

## 2018-03-15 MED ORDER — PREDNISONE 20 MG PO TABS: ORAL_TABLET | ORAL | 0 refills | Status: DC

## 2018-03-15 NOTE — Discharge Instructions (Signed)
°  Please call to schedule a follow up appointment with Sports Medicine in 1-2 weeks if not improving, sooner if worsening.

## 2018-03-15 NOTE — ED Provider Notes (Signed)
[facility]    CSN: [id number] Arrival date & time: 03/15/18  0847     History   Chief Complaint Chief Complaint  Patient presents with  . Shoulder Pain    HPI Derek Brewer is a 42 y.o. male.   HPI Derek Brewer is a 42 y.o. male presenting to UC with c/o Right shoulder pain for about 1 month. Pt was seen initially at [facility] on 02/16/18 for neck pain and stiffness that radiated down Right arm.  He notes the flexeril and Meloxicam have helped his neck stiffness significantly, however, he is still having occasional Right shoulder pain and numbness into his Right upper arm.  He is requesting to be re-evaluated again for same. He has not f/u with Sports Medicine yet.    Past Medical History:  Diagnosis Date  . Cyst of buttocks 01/2016   left buttock   . Dental crowns present    x 2    There are no active problems to display for this patient.   Past Surgical History:  Procedure Laterality Date  . ACHILLES TENDON REPAIR Left   . CYST EXCISION Left 01/24/2016   Procedure: EXCISION LEFT BUTTOCK CYST;  Surgeon: Coralie Keens, MD;  Location: [facility];  Service: General;  Laterality: Left;  . KNEE ARTHROSCOPY WITH PATELLAR TENDON REPAIR Right        Home Medications    Prior to Admission medications   Medication Sig Start Date End Date Taking? Authorizing Provider  cyclobenzaprine (FLEXERIL) 5 MG tablet Take 1-2 tablets (5-10 mg total) by mouth 2 (two) times daily as needed for muscle spasms. 02/16/18   Noe Gens, PA-C  meloxicam (MOBIC) 15 MG tablet Take 1 tablet (15 mg total) by mouth daily. For 5 days, then daily as needed for pain 02/16/18   Noe Gens, PA-C  Multiple Vitamin (MULTIVITAMIN) tablet Take 1 tablet by mouth daily.    [provider]  predniSONE (DELTASONE) 20 MG tablet 3 tabs po day one, then 2 po daily x 4 days 03/15/18   Noe Gens, PA-C    Family History No family history on file.  Social History Social  History   Tobacco Use  . Smoking status: Former Smoker    Last attempt to quit: 01/11/2015    Years since quitting: 3.1  . Smokeless tobacco: Never Used  . Tobacco comment: occasional vape smoker  Substance Use Topics  . Alcohol use: Yes    Comment: rarely  . Drug use: No     Allergies   Patient has no known allergies.   Review of Systems Review of Systems  Musculoskeletal: Positive for arthralgias, back pain, myalgias and neck pain. Negative for neck stiffness.  Skin: Negative for color change and rash.  Neurological: Positive for numbness. Negative for weakness.     Physical Exam Triage Vital Signs ED Triage Vitals [03/15/18 0923]  Enc Vitals Group     BP 119/78     Pulse Rate 66     Resp      Temp 98.3 F (36.8 C)     Temp Source Oral     SpO2 100 %     Weight      Height      Head Circumference      Peak Flow      Pain Score 2     Pain Loc      Pain Edu?      Excl. in [place]?  No data found.  Updated Vital Signs BP 119/78 (BP Location: Right Arm)   Pulse 66   Temp 98.3 F (36.8 C) (Oral)   SpO2 100%   Visual Acuity Right Eye Distance:   Left Eye Distance:   Bilateral Distance:    Right Eye Near:   Left Eye Near:    Bilateral Near:     Physical Exam  Constitutional: He is oriented to person, place, and time. He appears well-developed and well-nourished. No distress.  HENT:  Head: Normocephalic and atraumatic.  Eyes: EOM are normal.  Neck: Normal range of motion. Neck supple.  No spinal tenderness. Full ROM. Tenderness to Right side cervical muscles.  Cardiovascular: Normal rate.  Pulses:      Radial pulses are 2+ on the right side.  Pulmonary/Chest: Effort normal. No respiratory distress.  Musculoskeletal: Normal range of motion. He exhibits tenderness. He exhibits no edema.  Tenderness to Right side cervical muscles and Right upper trapezius. Full ROM upper and lower extremities. 5/5 strength in UEs. No bony tenderness to Right shoulder.    Neurological: He is alert and oriented to person, place, and time.  Skin: Skin is warm and dry. He is not diaphoretic.  Psychiatric: He has a normal mood and affect. His behavior is normal.  Nursing note and vitals reviewed.    UC Treatments / Results  Labs (all labs ordered are listed, but only abnormal results are displayed) Labs Reviewed - No data to display  EKG None  Radiology Dg Cervical Spine Complete  Result Date: 03/15/2018 CLINICAL DATA:  Patient with right-sided neck pain. Initial encounter. EXAM: CERVICAL SPINE - COMPLETE 4+ VIEW COMPARISON:  None. FINDINGS: Normal anatomic alignment. Multilevel degenerative disc disease most pronounced C4-5, C5-6 and C6-7 with anterior endplate osteophytosis. Preservation of the vertebral body heights. Prevertebral soft tissues are unremarkable. Lateral masses articulate appropriately with the dens. Lung apices are clear. IMPRESSION: Degenerative disc disease. Electronically Signed   By: Lovey Newcomer M.D.   On: 03/15/2018 09:52    Procedures Procedures (including critical care time)  Medications Ordered in UC Medications - No data to display  Initial Impression / Assessment and Plan / UC Course  I have reviewed the triage vital signs and the nursing notes.  Pertinent labs & imaging results that were available during my care of the patient were reviewed by me and considered in my medical decision making (see chart for details).     Discussed imaging with pt Symptoms due to DDD in cervical spine Will try a trial of oral prednisone.  Home instructions provided.  Final Clinical Impressions(s) / UC Diagnoses   Final diagnoses:  Neck pain on right side  Degenerative disc disease, cervical  Right cervical radiculopathy     Discharge Instructions      Please call to schedule a follow up appointment with Sports Medicine in 1-2 weeks if not improving, sooner if worsening.     ED Prescriptions    Medication Sig Dispense Auth.  Provider   predniSONE (DELTASONE) 20 MG tablet 3 tabs po day one, then 2 po daily x 4 days 11 tablet Noe Gens, PA-C     Controlled Substance Prescriptions [place] Controlled Substance Registry consulted? Not Applicable   Tyrell Antonio 03/15/18 1010

## 2018-03-15 NOTE — ED Triage Notes (Signed)
Right shoulder pain x 1 month, tingling radiates down right arm

## 2018-07-14 HISTORY — PX: EPIDURAL BLOCK INJECTION: SHX1516

## 2018-12-16 ENCOUNTER — Encounter: Payer: Self-pay | Admitting: Physician Assistant

## 2018-12-16 ENCOUNTER — Ambulatory Visit (INDEPENDENT_AMBULATORY_CARE_PROVIDER_SITE_OTHER): Payer: 59 | Admitting: Physician Assistant

## 2018-12-16 VITALS — BP 127/77 | HR 72 | Temp 98.5°F | Ht 71.0 in | Wt 172.0 lb

## 2018-12-16 DIAGNOSIS — Z1389 Encounter for screening for other disorder: Secondary | ICD-10-CM

## 2018-12-16 DIAGNOSIS — H6193 Disorder of external ear, unspecified, bilateral: Secondary | ICD-10-CM | POA: Insufficient documentation

## 2018-12-16 DIAGNOSIS — Z0000 Encounter for general adult medical examination without abnormal findings: Secondary | ICD-10-CM | POA: Diagnosis not present

## 2018-12-16 DIAGNOSIS — M5412 Radiculopathy, cervical region: Secondary | ICD-10-CM

## 2018-12-16 DIAGNOSIS — Z130 Encounter for screening for diseases of the blood and blood-forming organs and certain disorders involving the immune mechanism: Secondary | ICD-10-CM

## 2018-12-16 DIAGNOSIS — Z131 Encounter for screening for diabetes mellitus: Secondary | ICD-10-CM

## 2018-12-16 DIAGNOSIS — F17200 Nicotine dependence, unspecified, uncomplicated: Secondary | ICD-10-CM | POA: Insufficient documentation

## 2018-12-16 DIAGNOSIS — Z13220 Encounter for screening for lipoid disorders: Secondary | ICD-10-CM

## 2018-12-16 NOTE — Progress Notes (Signed)
HPI:                                                                Derek Brewer is a 43 y.o. male who presents to [facility] today to establish care   Requesting routine physical exam. Reports last CPE was approx 4 years ago. Denies any ongoing chronic health issues. Current light tobacco smoker, approx 1 pack per week x 25 years.  For several years he has had a recurrent itchy rash of both earlobes. Occasionally he is able to express a white purulent material from them. He has a history of ear piercings, otherwise no trauma. Reports he has had acne, abscesses and cyst excision of his scalp/neck in the past.  Depression screen [facility] 2/9 12/16/2018  Decreased Interest 0  Down, Depressed, Hopeless 0  PHQ - 2 Score 0  Altered sleeping 0  Tired, decreased energy 0  Change in appetite 0  Feeling bad or failure about yourself  0  Trouble concentrating 0  Moving slowly or fidgety/restless 0  Suicidal thoughts 0  PHQ-9 Score 0  Difficult doing work/chores Not difficult at all    No flowsheet data found.    Past Medical History:  Diagnosis Date  . Cervical radiculopathy   . Cyst of buttocks 01/2016   left buttock   . Dental crowns present    x 2  . Tobacco use    Past Surgical History:  Procedure Laterality Date  . ACHILLES TENDON REPAIR Left   . CYST EXCISION Left 01/24/2016   Procedure: EXCISION LEFT BUTTOCK CYST;  Surgeon: Coralie Keens, MD;  Location: [facility];  Service: General;  Laterality: Left;  . EPIDURAL BLOCK INJECTION  2020   cervical  . KNEE ARTHROSCOPY WITH PATELLAR TENDON REPAIR Right    Social History   Tobacco Use  . Smoking status: Light Tobacco Smoker    Years: 25.00    Types: Cigarettes  . Smokeless tobacco: Never Used  . Tobacco comment: 1 pack/week  Substance Use Topics  . Alcohol use: Yes    Comment: 2 drinks per week   family history includes Healthy in his mother;  Hypertension in his maternal grandmother; Lung cancer in his maternal grandfather; Stroke in his maternal grandmother.    Review of Systems  Musculoskeletal: Negative for neck pain.  Skin: Positive for itching and rash.  Neurological: Positive for tingling (left shoulder/trapezial parethesias and numbness).  All other systems reviewed and are negative.    Medications: Current Outpatient Medications  Medication Sig Dispense Refill  . Multiple Vitamin (MULTIVITAMIN) tablet Take 1 tablet by mouth daily.     No current facility-administered medications for this visit.    Allergies  Allergen Reactions  . Tylenol Cold & Flu Day-Night [Pe-Dm-Gg-Apap&Pe-Doxyl-Dm-Apap]        Objective:  BP 127/77   Pulse 72   Temp 98.5 F (36.9 C) (Oral)   Ht 5\' 11"  (1.803 m)   Wt 172 lb (78 kg)   SpO2 98%   BMI 23.99 kg/m  General Appearance:  Alert, cooperative, no distress, appropriate for age  Head:  Normocephalic, without obvious abnormality                             Eyes:  PERRL, EOM's intact, conjunctiva and cornea clear                             Ears:  TM pearly gray color and semitransparent, external ear canals normal, both ears                            Nose:  Nares symmetrical, mucosa pink                          Throat:  Lips, tongue, and mucosa are moist, pink, and intact; oropharynx clear, uvula midline; good dentition                             Neck:  Supple; symmetrical, trachea midline, no adenopathy; thyroid: no enlargement, symmetric, no tenderness/mass/nodules                             Back:  Symmetrical, no curvature, ROM normal                                        Lungs:  Clear to auscultation bilaterally, respirations unlabored                             Heart:  normal rate & regular rhythm, S1 and S2 normal, no murmurs, rubs, or gallops                     Abdomen:  Soft, non-tender, no mass or organomegaly               Genitourinary:  deferred         Musculoskeletal:  Tone and strength strong and symmetrical, all extremities; no joint pain or edema, normal gait and station                                    Lymphatic:  No adenopathy             Skin/Hair/Nails:  Multiple hyperpigmented cystic nodularities of bilateral ear lobes                   Neurologic:  Alert and oriented x3, no cranial nerve deficits, sensation grossly intact, normal gait and station, no tremor Psych: well-groomed, cooperative, good eye contact, euthymic mood, affect mood-congruent, speech is articulate, and thought processes clear and goal-directed   No results found for this or any previous visit (from the past 72 hour(s)). No results found.    Assessment and Plan: 43 y.o. male with   .Derek Brewer was seen today for establish care.  Diagnoses and all orders for this visit:  Encounter for annual physical exam -     CBC -     COMPLETE METABOLIC PANEL WITH GFR -     Lipid Profile -     Urinalysis, Routine w  reflex microscopic  Screening for blood disease -     CBC -     COMPLETE METABOLIC PANEL WITH GFR  Screening for diabetes mellitus -     COMPLETE METABOLIC PANEL WITH GFR  Screening for lipid disorders -     Lipid Profile  Screening for blood or protein in urine -     Urinalysis, Routine w reflex microscopic  Disorder of both ear lobes Comments: multiple cysts Orders: -     Ambulatory referral to Dermatology  Light tobacco smoker  Cervical radiculopathy   - Personally reviewed PMH, PSH, PFH, medications, allergies, HM - Age-appropriate cancer screening: colon cancer beginning age 98 - Tdap unknown, deferred by patient, await records - PHQ2 negative - Declined smoking cessation - Routine fasting labs pending  Nodularities of b/l ear lobes DDx includes epidermoid cysts, keloid, dermatitis/post-inflammatory hyperpigmentation No evidence of abscess or infection on exam today Referral to Dermatology     Patient education and anticipatory guidance given Patient agrees with treatment plan Follow-up in 1 year for CPE or sooner as needed if symptoms worsen or fail to improve  Darlyne Russian PA-C

## 2018-12-16 NOTE — Patient Instructions (Signed)
[facility] [address] [place], [place]   Preventive Care 40-64 Years, Male Preventive care refers to lifestyle choices and visits with your health care provider that can promote health and wellness. What does preventive care include?   A yearly physical exam. This is also called an annual well check.  Dental exams once or twice a year.  Routine eye exams. Ask your health care provider how often you should have your eyes checked.  Personal lifestyle choices, including: ? Daily care of your teeth and gums. ? Regular physical activity. ? Eating a healthy diet. ? Avoiding tobacco and drug use. ? Limiting alcohol use. ? Practicing safe sex. ? Taking low-dose aspirin every day starting at age 41. What happens during an annual well check? The services and screenings done by your health care provider during your annual well check will depend on your age, overall health, lifestyle risk factors, and family history of disease. Counseling Your health care provider may ask you questions about your:  Alcohol use.  Tobacco use.  Drug use.  Emotional well-being.  Home and relationship well-being.  Sexual activity.  Eating habits.  Work and work Statistician. Screening You may have the following tests or measurements:  Height, weight, and BMI.  Blood pressure.  Lipid and cholesterol levels. These may be checked every 5 years, or more frequently if you are over 53 years old.  Skin check.  Lung cancer screening. You may have this screening every year starting at age 16 if you have a 30-pack-year history of smoking and currently smoke or have quit within the past 15 years.  Colorectal cancer screening. All adults should have this screening starting at age 68 and continuing until age 21. Your health care provider may recommend screening at age 72. You will have tests every 1-10 years, depending on your results and the type of screening  test. People at increased risk should start screening at an earlier age. Screening tests may include: ? Guaiac-based fecal occult blood testing. ? Fecal immunochemical test (FIT). ? Stool DNA test. ? Virtual colonoscopy. ? Sigmoidoscopy. During this test, a flexible tube with a tiny camera (sigmoidoscope) is used to examine your rectum and lower colon. The sigmoidoscope is inserted through your anus into your rectum and lower colon. ? Colonoscopy. During this test, a long, thin, flexible tube with a tiny camera (colonoscope) is used to examine your entire colon and rectum.  Prostate cancer screening. Recommendations will vary depending on your family history and other risks.  Hepatitis C blood test.  Hepatitis B blood test.  Sexually transmitted disease (STD) testing.  Diabetes screening. This is done by checking your blood sugar (glucose) after you have not eaten for a while (fasting). You may have this done every 1-3 years. Discuss your test results, treatment options, and if necessary, the need for more tests with your health care provider. Vaccines Your health care provider may recommend certain vaccines, such as:  Influenza vaccine. This is recommended every year.  Tetanus, diphtheria, and acellular pertussis (Tdap, Td) vaccine. You may need a Td booster every 10 years.  Varicella vaccine. You may need this if you have not been vaccinated.  Zoster vaccine. You may need this after age 2.  Measles, mumps, and rubella (MMR) vaccine. You may need at least one dose of MMR if you were born in 1957 or later. You may also need a second dose.  Pneumococcal 13-valent conjugate (PCV13) vaccine. You may need this if  you have certain conditions and have not been vaccinated.  Pneumococcal polysaccharide (PPSV23) vaccine. You may need one or two doses if you smoke cigarettes or if you have certain conditions.  Meningococcal vaccine. You may need this if you have certain conditions.   Hepatitis A vaccine. You may need this if you have certain conditions or if you travel or work in places where you may be exposed to hepatitis A.  Hepatitis B vaccine. You may need this if you have certain conditions or if you travel or work in places where you may be exposed to hepatitis B.  Haemophilus influenzae type b (Hib) vaccine. You may need this if you have certain risk factors. Talk to your health care provider about which screenings and vaccines you need and how often you need them. This information is not intended to replace advice given to you by your health care provider. Make sure you discuss any questions you have with your health care provider. Document Released: 07/27/2015 Document Revised: 08/20/2017 Document Reviewed: 05/01/2015 Elsevier Interactive Patient Education  2019 [employer].

## 2018-12-17 LAB — COMPLETE METABOLIC PANEL WITH GFR
AG Ratio: 1.8 (calc) (ref 1.0–2.5)
ALT: 21 U/L (ref 9–46)
AST: 19 U/L (ref 10–40)
Albumin: 4.6 g/dL (ref 3.6–5.1)
Alkaline phosphatase (APISO): 58 U/L (ref 36–130)
BUN: 11 mg/dL (ref 7–25)
CO2: 26 mmol/L (ref 20–32)
Calcium: 9.4 mg/dL (ref 8.6–10.3)
Chloride: 102 mmol/L (ref 98–110)
Creat: 1.21 mg/dL (ref 0.60–1.35)
GFR, Est African American: 85 mL/min/{1.73_m2} (ref >=60)
GFR, Est Non African American: 73 mL/min/{1.73_m2} (ref >=60)
Globulin: 2.6 g/dL (calc) (ref 1.9–3.7)
Glucose, Bld: 98 mg/dL (ref 65–99)
Potassium: 3.8 mmol/L (ref 3.5–5.3)
Sodium: 138 mmol/L (ref 135–146)
Total Bilirubin: 0.7 mg/dL (ref 0.2–1.2)
Total Protein: 7.2 g/dL (ref 6.1–8.1)

## 2018-12-17 LAB — CBC
HCT: 45.4 % (ref 38.5–50.0)
Hemoglobin: 15.4 g/dL (ref 13.2–17.1)
MCH: 31.6 pg (ref 27.0–33.0)
MCHC: 33.9 g/dL (ref 32.0–36.0)
MCV: 93.2 fL (ref 80.0–100.0)
MPV: 11.8 fL (ref 7.5–12.5)
Platelets: 185 10*3/uL (ref 140–400)
RBC: 4.87 10*6/uL (ref 4.20–5.80)
RDW: 13.3 % (ref 11.0–15.0)
WBC: 8.3 10*3/uL (ref 3.8–10.8)

## 2018-12-17 LAB — URINALYSIS, ROUTINE W REFLEX MICROSCOPIC
Bilirubin Urine: NEGATIVE
Glucose, UA: NEGATIVE
Hgb urine dipstick: NEGATIVE
Ketones, ur: NEGATIVE
Leukocytes,Ua: NEGATIVE
Nitrite: NEGATIVE
Protein, ur: NEGATIVE
Specific Gravity, Urine: 1.019 (ref 1.001–1.03)
pH: <=5 (ref 5.0–8.0)

## 2018-12-17 LAB — LIPID PANEL
Cholesterol: 243 mg/dL — ABNORMAL HIGH (ref <200)
HDL: 77 mg/dL (ref >=40)
LDL Cholesterol (Calc): 143 mg/dL (calc) — ABNORMAL HIGH
Non-HDL Cholesterol (Calc): 166 mg/dL (calc) — ABNORMAL HIGH (ref <130)
Total CHOL/HDL Ratio: 3.2 (calc) (ref <5.0)
Triglycerides: 109 mg/dL (ref <150)

## 2018-12-20 ENCOUNTER — Encounter: Payer: Self-pay | Admitting: Physician Assistant

## 2018-12-20 DIAGNOSIS — E782 Mixed hyperlipidemia: Secondary | ICD-10-CM | POA: Insufficient documentation

## 2018-12-20 HISTORY — DX: Mixed hyperlipidemia: E78.2

## 2018-12-20 MED ORDER — ATORVASTATIN CALCIUM 10 MG PO TABS: 10.0000 mg | ORAL_TABLET | Freq: Every day | ORAL | 3 refills | Status: DC

## 2019-03-22 ENCOUNTER — Other Ambulatory Visit: Payer: Self-pay

## 2019-03-22 ENCOUNTER — Emergency Department (INDEPENDENT_AMBULATORY_CARE_PROVIDER_SITE_OTHER)
Admission: EM | Admit: 2019-03-22 | Discharge: 2019-03-22 | Disposition: A | Discharge status: Home / Self Care | Payer: 59 | Source: Home / Self Care | Attending: Emergency Medicine | Admitting: Emergency Medicine

## 2019-03-22 DIAGNOSIS — L700 Acne vulgaris: Secondary | ICD-10-CM

## 2019-03-22 DIAGNOSIS — R590 Localized enlarged lymph nodes: Secondary | ICD-10-CM

## 2019-03-22 MED ORDER — DOXYCYCLINE HYCLATE 100 MG PO CAPS: 100.0000 mg | ORAL_CAPSULE | Freq: Two times a day (BID) | ORAL | 0 refills | Status: DC

## 2019-03-22 NOTE — Discharge Instructions (Signed)
Clean the right side of your face with soap and water 3 times a day. Apply moist heat and massage to this area 3 times a day. Take antibiotics twice a day. If your lymph nodes remain swollen at the end of 10 days please see your primary care physician for follow-up.

## 2019-03-22 NOTE — ED Provider Notes (Signed)
[facility]    CSN: [id number] Arrival date & time: 03/22/19  0856      History   Chief Complaint Chief Complaint  Patient presents with   facial abcess    HPI CRHISTOPHER Brewer is a 43 y.o. male.   HPI Patient enters with onset last week of a cystic area on the right cheek.  He has had some drainage from this area and last night noticed swelling and discomfort on the right side of his neck.  He has a history of recurrent cysts.  He has had to be on antibiotics in the past for this.  He has also had I&D of previous cystic areas. Past Medical History:  Diagnosis Date   Cervical radiculopathy    Cyst of buttocks 01/2016   left buttock    Dental crowns present    x 2   Mixed hyperlipidemia 12/20/2018   Tobacco use     Patient Active Problem List   Diagnosis Date Noted   Mixed hyperlipidemia 12/20/2018   Disorder of both ear lobes 12/16/2018   Light tobacco smoker 12/16/2018   Cervical radiculopathy     Past Surgical History:  Procedure Laterality Date   ACHILLES TENDON REPAIR Left    CYST EXCISION Left 01/24/2016   Procedure: EXCISION LEFT BUTTOCK CYST;  Surgeon: Coralie Keens, MD;  Location: [facility];  Service: General;  Laterality: Left;   EPIDURAL BLOCK INJECTION  2020   cervical   KNEE ARTHROSCOPY WITH PATELLAR TENDON REPAIR Right        Home Medications    Prior to Admission medications   Medication Sig Start Date End Date Taking? Authorizing Provider  atorvastatin (LIPITOR) 10 MG tablet Take 1 tablet (10 mg total) by mouth daily. 12/20/18   Trixie Dredge, PA-C  doxycycline (VIBRAMYCIN) 100 MG capsule Take 1 capsule (100 mg total) by mouth 2 (two) times daily. 03/22/19   Darlyne Russian, MD  Multiple Vitamin (MULTIVITAMIN) tablet Take 1 tablet by mouth daily.    [provider]    Family History Family History  Problem Relation Age of Onset   Healthy Mother    Lung cancer Maternal  Grandfather    Hypertension Maternal Grandmother    Stroke Maternal Grandmother     Social History Social History   Tobacco Use   Smoking status: Light Tobacco Smoker    Years: 25.00    Types: Cigarettes   Smokeless tobacco: Never Used   Tobacco comment: 1 pack/week  Substance Use Topics   Alcohol use: Yes    Comment: 2 drinks per week   Drug use: Never     Allergies   Tylenol cold & flu day-night [pe-dm-gg-apap&pe-doxyl-dm-apap]   Review of Systems Review of Systems  Constitutional: Negative.   HENT: Negative.   Skin:       Patient has a cystic area on the right side of his face as well up knots which have developed on the right side of his neck.  Hematological: Positive for adenopathy.     Physical Exam Triage Vital Signs ED Triage Vitals  Enc Vitals Group     BP 03/22/19 0936 129/80     Pulse Rate 03/22/19 0936 (!) 56     Resp 03/22/19 0936 20     Temp 03/22/19 0936 98.2 F (36.8 C)     Temp Source 03/22/19 0936 Oral     SpO2 03/22/19 0936 99 %     Weight 03/22/19 0937 161  lb (73 kg)     Height 03/22/19 0937 5\' 11"  (1.803 m)     Head Circumference --      Peak Flow --      Pain Score 03/22/19 0936 0     Pain Loc --      Pain Edu? --      Excl. in [place]? --    No data found.  Updated Vital Signs BP 129/80 (BP Location: Right Arm)    Pulse (!) 56    Temp 98.2 F (36.8 C) (Oral)    Resp 20    Ht 5\' 11"  (1.803 m)    Wt 73 kg    SpO2 99%    BMI 22.45 kg/m   Visual Acuity Right Eye Distance:   Left Eye Distance:   Bilateral Distance:    Right Eye Near:   Left Eye Near:    Bilateral Near:     Physical Exam Constitutional:      Appearance: Normal appearance.  HENT:     Head:     Comments: On the right side of the face or old areas of acne.  There is a 1 x 1 cm cystic area.  This area was compressed and a droplet of purulent material was obtained.  On the right side of the neck are multiple cervical nodes greatest being 1-1/2 cm.    Nose:  Nose normal.  Neurological:     Mental Status: He is alert.      UC Treatments / Results  Labs (all labs ordered are listed, but only abnormal results are displayed) Labs Reviewed  WOUND CULTURE    EKG   Radiology No results found.  Procedures Procedures (including critical care time)  Medications Ordered in UC Medications - No data to display  Initial Impression / Assessment and Plan / UC Course  I have reviewed the triage vital signs and the nursing notes. Patient has an infected cyst on the right side of the face with developing lymphadenopathy.  Will treat with antibiotics as well as warm compresses massage to the cystic area.  We will recheck the nodes right side of the neck if they have not resolved in the next 10 days wound culture was done. Pertinent labs & imaging results that were available during my care of the patient were reviewed by me and considered in my medical decision making (see chart for details).      Final Clinical Impressions(s) / UC Diagnoses   Final diagnoses:  Cystic acne  Lymphadenopathy of head and neck region     Discharge Instructions     Clean the right side of your face with soap and water 3 times a day. Apply moist heat and massage to this area 3 times a day. Take antibiotics twice a day. If your lymph nodes remain swollen at the end of 10 days please see your primary care physician for follow-up.    ED Prescriptions    Medication Sig Dispense Auth. Provider   doxycycline (VIBRAMYCIN) 100 MG capsule Take 1 capsule (100 mg total) by mouth 2 (two) times daily. 20 capsule Darlyne Russian, MD     Controlled Substance Prescriptions [place] Controlled Substance Registry consulted? Not Applicable   Darlyne Russian, MD 03/22/19 1444

## 2019-03-22 NOTE — ED Triage Notes (Signed)
Pt states that he felt a knot on right side of face/neck area for about 3 days.  Draining last few days outside.  Knot and swelling since last night.  Used ibuprofen last.

## 2019-03-25 LAB — WOUND CULTURE
MICRO NUMBER:: 855563
SPECIMEN QUALITY:: ADEQUATE

## 2019-03-26 ENCOUNTER — Telehealth: Payer: Self-pay

## 2019-03-26 NOTE — Telephone Encounter (Signed)
Pt notified of neg culture. Advised to f/u with PCP if lymph node enlargement continues.

## 2019-04-07 ENCOUNTER — Ambulatory Visit (INDEPENDENT_AMBULATORY_CARE_PROVIDER_SITE_OTHER): Payer: 59 | Admitting: Family Medicine

## 2019-04-07 ENCOUNTER — Other Ambulatory Visit: Payer: Self-pay

## 2019-04-07 VITALS — Temp 98.5°F

## 2019-04-07 DIAGNOSIS — Z23 Encounter for immunization: Secondary | ICD-10-CM

## 2019-04-07 NOTE — Progress Notes (Signed)
Pt came into clinic today for tdap and flu vaccine. Pt was given a flu shot questionnaire prior to administration of immunization. All questions were answered no. Pt tolerated injection of flu immunization in right deltoid and tdap immunization in left deltoid well, with no immediate complications. Printed immunization record provided. Advised to contact our office with any questions/concerns.

## 2019-12-04 ENCOUNTER — Other Ambulatory Visit: Payer: Self-pay | Admitting: Physician Assistant

## 2019-12-04 DIAGNOSIS — E782 Mixed hyperlipidemia: Secondary | ICD-10-CM

## 2019-12-05 ENCOUNTER — Encounter: Payer: Self-pay | Admitting: Physician Assistant

## 2019-12-16 ENCOUNTER — Encounter: Payer: 59 | Admitting: Physician Assistant

## 2019-12-19 ENCOUNTER — Other Ambulatory Visit: Payer: Self-pay

## 2019-12-19 ENCOUNTER — Encounter: Payer: Self-pay | Admitting: Family Medicine

## 2019-12-19 ENCOUNTER — Ambulatory Visit (INDEPENDENT_AMBULATORY_CARE_PROVIDER_SITE_OTHER): Payer: 59 | Admitting: Family Medicine

## 2019-12-19 VITALS — BP 147/73 | HR 69 | Temp 98.8°F | Ht 70.87 in | Wt 170.0 lb

## 2019-12-19 DIAGNOSIS — E782 Mixed hyperlipidemia: Secondary | ICD-10-CM

## 2019-12-19 DIAGNOSIS — Z0000 Encounter for general adult medical examination without abnormal findings: Secondary | ICD-10-CM | POA: Diagnosis not present

## 2019-12-19 MED ORDER — ATORVASTATIN CALCIUM 10 MG PO TABS: 10.0000 mg | ORAL_TABLET | Freq: Every day | ORAL | 3 refills | Status: DC

## 2019-12-19 NOTE — Patient Instructions (Signed)
Great to meet you today! Try voltaren gel to the knees as directed on packaging.    Preventive Care 75-43 Years Old, Male Preventive care refers to lifestyle choices and visits with your health care provider that can promote health and wellness. This includes:  A yearly physical exam. This is also called an annual well check.  Regular dental and eye exams.  Immunizations.  Screening for certain conditions.  Healthy lifestyle choices, such as eating a healthy diet, getting regular exercise, not using drugs or products that contain nicotine and tobacco, and limiting alcohol use. What can I expect for my preventive care visit? Physical exam Your health care provider will check:  Height and weight. These may be used to calculate body mass index (BMI), which is a measurement that tells if you are at a healthy weight.  Heart rate and blood pressure.  Your skin for abnormal spots. Counseling Your health care provider may ask you questions about:  Alcohol, tobacco, and drug use.  Emotional well-being.  Home and relationship well-being.  Sexual activity.  Eating habits.  Work and work Statistician. What immunizations do I need?  Influenza (flu) vaccine  This is recommended every year. Tetanus, diphtheria, and pertussis (Tdap) vaccine  You may need a Td booster every 10 years. Varicella (chickenpox) vaccine  You may need this vaccine if you have not already been vaccinated. Zoster (shingles) vaccine  You may need this after age 37. Measles, mumps, and rubella (MMR) vaccine  You may need at least one dose of MMR if you were born in 1957 or later. You may also need a second dose. Pneumococcal conjugate (PCV13) vaccine  You may need this if you have certain conditions and were not previously vaccinated. Pneumococcal polysaccharide (PPSV23) vaccine  You may need one or two doses if you smoke cigarettes or if you have certain conditions. Meningococcal conjugate (MenACWY)  vaccine  You may need this if you have certain conditions. Hepatitis A vaccine  You may need this if you have certain conditions or if you travel or work in places where you may be exposed to hepatitis A. Hepatitis B vaccine  You may need this if you have certain conditions or if you travel or work in places where you may be exposed to hepatitis B. Haemophilus influenzae type b (Hib) vaccine  You may need this if you have certain risk factors. Human papillomavirus (HPV) vaccine  If recommended by your health care provider, you may need three doses over 6 months. You may receive vaccines as individual doses or as more than one vaccine together in one shot (combination vaccines). Talk with your health care provider about the risks and benefits of combination vaccines. What tests do I need? Blood tests  Lipid and cholesterol levels. These may be checked every 5 years, or more frequently if you are over 72 years old.  Hepatitis C test.  Hepatitis B test. Screening  Lung cancer screening. You may have this screening every year starting at age 9 if you have a 30-pack-year history of smoking and currently smoke or have quit within the past 15 years.  Prostate cancer screening. Recommendations will vary depending on your family history and other risks.  Colorectal cancer screening. All adults should have this screening starting at age 78 and continuing until age 59. Your health care provider may recommend screening at age 29 if you are at increased risk. You will have tests every 1-10 years, depending on your results and the type of screening  test.  Diabetes screening. This is done by checking your blood sugar (glucose) after you have not eaten for a while (fasting). You may have this done every 1-3 years.  Sexually transmitted disease (STD) testing. Follow these instructions at home: Eating and drinking  Eat a diet that includes fresh fruits and vegetables, whole grains, lean protein,  and low-fat dairy products.  Take vitamin and mineral supplements as recommended by your health care provider.  Do not drink alcohol if your health care provider tells you not to drink.  If you drink alcohol: ? Limit how much you have to 0-2 drinks a day. ? Be aware of how much alcohol is in your drink. In the [place] one drink equals one 12 oz bottle of beer (355 mL), one 5 oz glass of wine (148 mL), or one 1 oz glass of hard liquor (44 mL). Lifestyle  Take daily care of your teeth and gums.  Stay active. Exercise for at least 30 minutes on 5 or more days each week.  Do not use any products that contain nicotine or tobacco, such as cigarettes, e-cigarettes, and chewing tobacco. If you need help quitting, ask your health care provider.  If you are sexually active, practice safe sex. Use a condom or other form of protection to prevent STIs (sexually transmitted infections).  Talk with your health care provider about taking a low-dose aspirin every day starting at age 39. What's next?  Go to your health care provider once a year for a well check visit.  Ask your health care provider how often you should have your eyes and teeth checked.  Stay up to date on all vaccines. This information is not intended to replace advice given to you by your health care provider. Make sure you discuss any questions you have with your health care provider. Document Revised: 06/24/2018 Document Reviewed: 06/24/2018 Elsevier Patient Education  2020 [employer].

## 2019-12-19 NOTE — Assessment & Plan Note (Signed)
Well adult Orders Placed This Encounter  Procedures   COMPLETE METABOLIC PANEL WITH GFR   CBC   Lipid Profile  Screening: UTD Immunizations: UTD Anticipatory guidance/Risk factor reduction:  Recommend smoking cessation.  Recommendations per AVS.

## 2019-12-19 NOTE — Progress Notes (Signed)
Derek Brewer - 44 y.o. male MRN [id number]  Date of birth: Nov 11, 1975  Subjective Chief Complaint  Patient presents with  . Annual Exam    HPI Derek Brewer is a 44 y.o. male with history of HLD here today for annual exam.  He has had some mild knee pain with activity.  No injury.  Denies swelling of joints.    He smokes cigarette occasionally 1-2/day.  He consumes EtOH in moderation.    He does exercise regularly. He follows a healthy diet.   Review of Systems  Constitutional: Negative for chills, fever, malaise/fatigue and weight loss.  HENT: Negative for congestion, ear pain and sore throat.   Eyes: Negative for blurred vision, double vision and pain.  Respiratory: Negative for cough and shortness of breath.   Cardiovascular: Negative for chest pain and palpitations.  Gastrointestinal: Negative for abdominal pain, blood in stool, constipation, heartburn and nausea.  Genitourinary: Negative for dysuria and urgency.  Musculoskeletal: Negative for joint pain and myalgias.  Neurological: Negative for dizziness and headaches.  Endo/Heme/Allergies: Does not bruise/bleed easily.  Psychiatric/Behavioral: Negative for depression. The patient is not nervous/anxious and does not have insomnia.     Allergies  Allergen Reactions  . Tylenol Cold & Flu Day-Night [Pe-Dm-Gg-Apap&Pe-Doxyl-Dm-Apap]     Past Medical History:  Diagnosis Date  . Cervical radiculopathy   . Cyst of buttocks 01/2016   left buttock   . Dental crowns present    x 2  . Mixed hyperlipidemia 12/20/2018  . Tobacco use     Past Surgical History:  Procedure Laterality Date  . ACHILLES TENDON REPAIR Left   . CYST EXCISION Left 01/24/2016   Procedure: EXCISION LEFT BUTTOCK CYST;  Surgeon: Coralie Keens, MD;  Location: [place];  Service: General;  Laterality: Left;  . EPIDURAL BLOCK INJECTION  2020   cervical  . KNEE ARTHROSCOPY WITH PATELLAR TENDON REPAIR Right     Social History    Socioeconomic History  . Marital status: Married    Spouse name: Not on file  . Number of children: Not on file  . Years of education: Not on file  . Highest education level: Not on file  Occupational History  . Not on file  Tobacco Use  . Smoking status: Light Tobacco Smoker    Years: 25.00    Types: Cigarettes  . Smokeless tobacco: Never Used  . Tobacco comment: 1 pack/week  Substance and Sexual Activity  . Alcohol use: Yes    Comment: 2 drinks per week  . Drug use: Never  . Sexual activity: Yes    Partners: Female    Comment: one partner  Other Topics Concern  . Not on file  Social History Narrative  . Not on file   Social Determinants of Health   Financial Resource Strain:   . Difficulty of Paying Living Expenses:   Food Insecurity:   . Worried About Charity fundraiser in the Last Year:   . Arboriculturist in the Last Year:   Transportation Needs:   . Film/video editor (Medical):   Marland Kitchen Lack of Transportation (Non-Medical):   Physical Activity:   . Days of Exercise per Week:   . Minutes of Exercise per Session:   Stress:   . Feeling of Stress :   Social Connections:   . Frequency of Communication with Friends and Family:   . Frequency of Social Gatherings with Friends and Family:   . Attends Religious Services:   .  Active Member of Clubs or Organizations:   . Attends Archivist Meetings:   Marland Kitchen Marital Status:     Family History  Problem Relation Age of Onset  . Healthy Mother   . Lung cancer Maternal Grandfather   . Hypertension Maternal Grandmother   . Stroke Maternal Grandmother     Health Maintenance  Topic Date Due  . Hepatitis C Screening  Never done  . HIV Screening  Never done  . INFLUENZA VACCINE  02/12/2020  . TETANUS/TDAP  04/06/2029  . COVID-19 Vaccine  Completed      ----------------------------------------------------------------------------------------------------------------------------------------------------------------------------------------------------------------- Physical Exam BP (!) 147/73 (BP Location: Left Arm, Patient Position: Sitting, Cuff Size: Large)   Pulse 69   Temp 98.8 F (37.1 C) (Oral)   Ht 5' 10.87" (1.8 m)   Wt 170 lb (77.1 kg)   SpO2 100%   BMI 23.80 kg/m   Physical Exam Constitutional:      General: He is not in acute distress. HENT:     Head: Normocephalic and atraumatic.     Right Ear: Tympanic membrane and external ear normal.     Left Ear: Tympanic membrane and external ear normal.  Eyes:     General: No scleral icterus. Neck:     Thyroid: No thyromegaly.  Cardiovascular:     Rate and Rhythm: Normal rate and regular rhythm.     Heart sounds: Normal heart sounds.  Pulmonary:     Effort: Pulmonary effort is normal.     Breath sounds: Normal breath sounds.  Abdominal:     General: Bowel sounds are normal. There is no distension.     Palpations: Abdomen is soft.     Tenderness: There is no abdominal tenderness. There is no guarding.  Musculoskeletal:     Cervical back: Normal range of motion.  Lymphadenopathy:     Cervical: No cervical adenopathy.  Skin:    General: Skin is warm and dry.     Findings: No rash.  Neurological:     Mental Status: He is alert and oriented to person, place, and time.     Cranial Nerves: No cranial nerve deficit.     Motor: No abnormal muscle tone.  Psychiatric:        Mood and Affect: Mood normal.        Behavior: Behavior normal.     ------------------------------------------------------------------------------------------------------------------------------------------------------------------------------------------------------------------- Assessment and Plan  Well adult exam Well adult Orders Placed This Encounter  Procedures  . COMPLETE METABOLIC PANEL  WITH GFR  . CBC  . Lipid Profile  Screening: UTD Immunizations: UTD Anticipatory guidance/Risk factor reduction:  Recommend smoking cessation.  Recommendations per AVS.     Meds ordered this encounter  Medications  . atorvastatin (LIPITOR) 10 MG tablet    Sig: Take 1 tablet (10 mg total) by mouth daily.    Dispense:  90 tablet    Refill:  3    No follow-ups on file.    This visit occurred during the SARS-CoV-2 public health emergency.  Safety protocols were in place, including screening questions prior to the visit, additional usage of staff PPE, and extensive cleaning of exam room while observing appropriate contact time as indicated for disinfecting solutions.

## 2019-12-22 LAB — LIPID PANEL
Cholesterol: 212 mg/dL — ABNORMAL HIGH (ref <200)
HDL: 82 mg/dL (ref >=40)
LDL Cholesterol (Calc): 102 mg/dL (calc) — ABNORMAL HIGH
Non-HDL Cholesterol (Calc): 130 mg/dL (calc) — ABNORMAL HIGH (ref <130)
Total CHOL/HDL Ratio: 2.6 (calc) (ref <5.0)
Triglycerides: 164 mg/dL — ABNORMAL HIGH (ref <150)

## 2019-12-22 LAB — COMPLETE METABOLIC PANEL WITH GFR
AG Ratio: 1.7 (calc) (ref 1.0–2.5)
ALT: 36 U/L (ref 9–46)
AST: 24 U/L (ref 10–40)
Albumin: 4.7 g/dL (ref 3.6–5.1)
Alkaline phosphatase (APISO): 74 U/L (ref 36–130)
BUN/Creatinine Ratio: 9 (calc) (ref 6–22)
BUN: 12 mg/dL (ref 7–25)
CO2: 29 mmol/L (ref 20–32)
Calcium: 9.7 mg/dL (ref 8.6–10.3)
Chloride: 100 mmol/L (ref 98–110)
Creat: 1.38 mg/dL — ABNORMAL HIGH (ref 0.60–1.35)
GFR, Est African American: 72 mL/min/{1.73_m2} (ref >=60)
GFR, Est Non African American: 62 mL/min/{1.73_m2} (ref >=60)
Globulin: 2.8 g/dL (calc) (ref 1.9–3.7)
Glucose, Bld: 82 mg/dL (ref 65–99)
Potassium: 4.6 mmol/L (ref 3.5–5.3)
Sodium: 138 mmol/L (ref 135–146)
Total Bilirubin: 0.6 mg/dL (ref 0.2–1.2)
Total Protein: 7.5 g/dL (ref 6.1–8.1)

## 2019-12-22 LAB — CBC
HCT: 50.4 % — ABNORMAL HIGH (ref 38.5–50.0)
Hemoglobin: 16.8 g/dL (ref 13.2–17.1)
MCH: 32.1 pg (ref 27.0–33.0)
MCHC: 33.3 g/dL (ref 32.0–36.0)
MCV: 96.2 fL (ref 80.0–100.0)
MPV: 12 fL (ref 7.5–12.5)
Platelets: 174 10*3/uL (ref 140–400)
RBC: 5.24 10*6/uL (ref 4.20–5.80)
RDW: 13.5 % (ref 11.0–15.0)
WBC: 7.3 10*3/uL (ref 3.8–10.8)

## 2020-04-23 ENCOUNTER — Telehealth: Payer: Self-pay | Admitting: Family Medicine

## 2020-04-23 NOTE — Telephone Encounter (Signed)
Patient dropped off a form to be filled out this morning (04/23/20) , I attached a billing form & placed in provider box. AM

## 2020-04-23 NOTE — Telephone Encounter (Signed)
Form completed and placed in assistant's box

## 2020-04-24 NOTE — Telephone Encounter (Signed)
Form has been faxed to [facility].

## 2020-09-07 ENCOUNTER — Encounter: Payer: Self-pay | Admitting: Family Medicine

## 2020-12-06 ENCOUNTER — Other Ambulatory Visit: Payer: Self-pay | Admitting: Family Medicine

## 2020-12-06 DIAGNOSIS — E782 Mixed hyperlipidemia: Secondary | ICD-10-CM

## 2020-12-06 NOTE — Telephone Encounter (Signed)
Please call and schedule the patient for annual appointment. Thank you.  Sent 30 day refill.

## 2020-12-22 ENCOUNTER — Other Ambulatory Visit: Payer: Self-pay

## 2020-12-22 ENCOUNTER — Emergency Department
Admission: RE | Admit: 2020-12-22 | Discharge: 2020-12-22 | Disposition: A | Discharge status: Home / Self Care | Payer: 59 | Source: Ambulatory Visit

## 2020-12-22 VITALS — BP 113/82 | HR 140 | Temp 100.0°F | Resp 18 | Ht 71.0 in | Wt 165.0 lb

## 2020-12-22 DIAGNOSIS — R000 Tachycardia, unspecified: Secondary | ICD-10-CM | POA: Diagnosis not present

## 2020-12-22 DIAGNOSIS — L0231 Cutaneous abscess of buttock: Secondary | ICD-10-CM | POA: Diagnosis not present

## 2020-12-22 MED ORDER — ACETAMINOPHEN 325 MG PO TABS
650.0000 mg | ORAL_TABLET | Freq: Once | ORAL | Status: AC
  Administered 2020-12-22: 650 mg via ORAL

## 2020-12-22 NOTE — ED Triage Notes (Signed)
Hx of cysts in past  Noted small cyst/abscess on right buttock  Epsom salt soaks  Warm compresses alt w/ ice for pain  OTC ibuprofen yesterday

## 2020-12-22 NOTE — ED Provider Notes (Signed)
[facility]    CSN: [id number] Arrival date & time: 12/22/20  1344      History   Chief Complaint Chief Complaint  Patient presents with   Abscess    Left buttock    HPI Derek Brewer is a 45 y.o. male.   HPI 45 year old male presents with abscess of left buttocks and elevated heart rate.  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 Authorizing Provider  atorvastatin (LIPITOR) 10 MG tablet TAKE 1 TABLET BY MOUTH EVERY DAY 12/06/20  Yes Luetta Nutting, DO  Multiple Vitamin (MULTIVITAMIN) tablet Take 1 tablet by mouth daily.   Yes [provider]    Family History Family History  Problem Relation Age of Onset   Healthy Mother    Lung cancer Maternal Grandfather    Hypertension Maternal Grandmother    Stroke Maternal Grandmother     Social History Social History   Tobacco Use   Smoking status: Light Smoker    Years: 25.00    Pack years: 0.00    Types: Cigarettes   Smokeless tobacco: Never   Tobacco comments:    1 pack/week  Vaping Use   Vaping Use: Former  Substance Use Topics   Alcohol use: Yes    Comment:  2 drinks per week   Drug use: Never     Allergies   Tylenol cold & flu day-night [[id number]-dm-gg-apap&pe-doxyl-dm-apap]   Review of Systems Review of Systems  Constitutional: Negative.   HENT: Negative.    Eyes: Negative.   Respiratory: Negative.    Cardiovascular: Negative.   Gastrointestinal: Negative.   Genitourinary: Negative.   Musculoskeletal: Negative.   Skin:        Abscess of right buttocks  Neurological: Negative.     Physical Exam Triage Vital Signs ED Triage Vitals  Enc Vitals Group     BP 12/22/20 1405 113/82     Pulse Rate 12/22/20 1405 (!) 140     Resp 12/22/20 1405 18     Temp 12/22/20 1405 100 F (37.8 C)     Temp Source 12/22/20 1405 Oral     SpO2 12/22/20 1405 98 %     Weight 12/22/20 1413 165 lb (74.8 kg)     Height 12/22/20 1413 5\' 11"  (1.803 m)     Head Circumference --      Peak Flow --      Pain Score 12/22/20 1413 10     Pain Loc --      Pain Edu? --  Excl. in GC? --    No data found.  Updated Vital Signs BP 113/82 (BP Location: Right Arm)   Pulse (!) 140   Temp 100 F (37.8 C) (Oral)   Resp 18   Ht 5\' 11"  (1.803 m)   Wt 165 lb (74.8 kg)   SpO2 98%   BMI 23.01 kg/m     Physical Exam Vitals and nursing note reviewed.  Constitutional:      Appearance: Normal appearance. He is ill-appearing.  HENT:     Head: Normocephalic and atraumatic.     Mouth/Throat:     Mouth: Mucous membranes are moist.     Pharynx: Oropharynx is clear.  Eyes:     Extraocular Movements: Extraocular movements intact.     Conjunctiva/sclera: Conjunctivae normal.     Pupils: Pupils are equal, round, and reactive to light.  Cardiovascular:     Rate and Rhythm: Regular rhythm. Tachycardia present.     Pulses: Normal pulses.     Heart sounds: Normal heart sounds.  Pulmonary:     Effort: Pulmonary effort is normal. No respiratory distress.     Breath sounds: Normal breath sounds. No stridor. No wheezing, rhonchi or rales.  Musculoskeletal:         General: Normal range of motion.     Cervical back: Normal range of motion and neck supple.  Skin:    Comments: Perirectal abscess of right buttocks ~3.0 cm x 3.0 cm erythematous, indurated, fluctuant  Neurological:     General: No focal deficit present.     Mental Status: He is alert and oriented to person, place, and time.  Psychiatric:        Mood and Affect: Mood normal.        Behavior: Behavior normal.     UC Treatments / Results  Labs (all labs ordered are listed, but only abnormal results are displayed) Labs Reviewed - No data to display  EKG   Radiology No results found.  Procedures Procedures (including critical care time)  Medications Ordered in UC Medications  acetaminophen (TYLENOL) tablet 650 mg (650 mg Oral Given 12/22/20 1419)    Initial Impression / Assessment and Plan / UC Course  I have reviewed the triage vital signs and the nursing notes.  Pertinent labs & imaging results that were available during my care of the patient were reviewed by me and considered in my medical decision making (see chart for details).    MDM: 1.  Tachycardia, 2.  Abscess of right buttocks.  Patient hemodynamically stable discharged home.  Instructed patient to go to [facility] now for further evaluation. Final Clinical Impressions(s) / UC Diagnoses   Final diagnoses:  Abscess of buttock, right  Tachycardia     Discharge Instructions      Instructed patient to go to an [facility] now for further evaluation of tachycardia and right buttock abscess.     ED Prescriptions   None    PDMP not reviewed this encounter.   Eliezer Lofts, [place] 12/22/20 1656

## 2020-12-22 NOTE — ED Notes (Signed)
Patient is being discharged from the Urgent Care and sent to the Emergency Department via POV - pt drove self . Per M. Ragan, [place], patient is in need of higher level of care due to sinus  tachycardia Patient is aware and verbalizes understanding of plan of care.  Vitals:   12/22/20 1405  BP: 113/82  Pulse: (!) 140  Resp: 18  Temp: 100 F (37.8 C)  SpO2: 98%   Pt to drive self to [facility] ED - HR is 140-146 - pt denies chest pain or SOB or dizzyness

## 2020-12-22 NOTE — Discharge Instructions (Addendum)
Instructed patient to go to an [facility] now for further evaluation of tachycardia and right buttock abscess.

## 2020-12-24 ENCOUNTER — Telehealth: Payer: Self-pay

## 2020-12-24 NOTE — Telephone Encounter (Signed)
Transition Care Management Follow-up Telephone Call Date of discharge and from where: 12/22/2020 from [facility] How have you been since you were released from the hospital? Doxycycling 100 mg po bid for 10 days.  Any questions or concerns? No  Items Reviewed: Did the pt receive and understand the discharge instructions provided? Yes  Medications obtained and verified? No  Other? No  Any new allergies since your discharge? No  Dietary orders reviewed? No Do you have support at home? Yes   Functional Questionnaire: (I = Independent and D = Dependent) ADLs: I  Bathing/Dressing- I  Meal Prep- I  Eating- I  Maintaining continence- I  Transferring/Ambulation- I  Managing Meds- I   Follow up appointments reviewed:  PCP Hospital f/u appt confirmed? Yes  Scheduled to see Luetta Nutting, DO on 12/25/2020 @ 03:50pm. [facility] f/u appt confirmed? Yes  Scheduled to see Surgeon follow up is being scheduled.  Are transportation arrangements needed? No  If their condition worsens, is the pt aware to call PCP or go to the Emergency Dept.? Yes Was the patient provided with contact information for the PCP's office or ED? Yes Was to pt encouraged to call back with questions or concerns? Yes

## 2020-12-24 NOTE — Telephone Encounter (Signed)
Transition Care Management Unsuccessful Follow-up Telephone Call  Date of discharge and from where:  0611/2022 from [facility]  Attempts:  1st Attempt  Reason for unsuccessful TCM follow-up call:  Left voice message

## 2020-12-25 ENCOUNTER — Encounter: Payer: 59 | Admitting: Family Medicine

## 2020-12-25 ENCOUNTER — Other Ambulatory Visit: Payer: Self-pay

## 2020-12-25 ENCOUNTER — Ambulatory Visit (INDEPENDENT_AMBULATORY_CARE_PROVIDER_SITE_OTHER): Payer: 59 | Admitting: Family Medicine

## 2020-12-25 ENCOUNTER — Encounter: Payer: Self-pay | Admitting: Family Medicine

## 2020-12-25 VITALS — BP 135/75 | HR 91 | Temp 98.4°F | Ht 71.0 in | Wt 175.3 lb

## 2020-12-25 DIAGNOSIS — E782 Mixed hyperlipidemia: Secondary | ICD-10-CM | POA: Diagnosis not present

## 2020-12-25 DIAGNOSIS — Z0000 Encounter for general adult medical examination without abnormal findings: Secondary | ICD-10-CM | POA: Diagnosis not present

## 2020-12-25 DIAGNOSIS — K610 Anal abscess: Secondary | ICD-10-CM | POA: Diagnosis not present

## 2020-12-25 NOTE — Assessment & Plan Note (Signed)
Packing removed.  He will complete course of doxycycline.

## 2020-12-25 NOTE — Patient Instructions (Signed)
Complete course of doxycycline.    Preventive Care 44-44 Years Old, Male Preventive care refers to lifestyle choices and visits with your health care provider that can promote health and wellness. This includes: A yearly physical exam. This is also called an annual wellness visit. Regular dental and eye exams. Immunizations. Screening for certain conditions. Healthy lifestyle choices, such as: Eating a healthy diet. Getting regular exercise. Not using drugs or products that contain nicotine and tobacco. Limiting alcohol use. What can I expect for my preventive care visit? Physical exam Your health care provider will check your: Height and weight. These may be used to calculate your BMI (body mass index). BMI is a measurement that tells if you are at a healthy weight. Heart rate and blood pressure. Body temperature. Skin for abnormal spots. Counseling Your health care provider may ask you questions about your: Past medical problems. Family's medical history. Alcohol, tobacco, and drug use. Emotional well-being. Home life and relationship well-being. Sexual activity. Diet, exercise, and sleep habits. Work and work Statistician. Access to firearms. What immunizations do I need?  Vaccines are usually given at various ages, according to a schedule. Your health care provider will recommend vaccines for you based on your age, medicalhistory, and lifestyle or other factors, such as travel or where you work. What tests do I need? Blood tests Lipid and cholesterol levels. These may be checked every 5 years, or more often if you are over 30 years old. Hepatitis C test. Hepatitis B test. Screening Lung cancer screening. You may have this screening every year starting at age 61 if you have a 30-pack-year history of smoking and currently smoke or have quit within the past 15 years. Prostate cancer screening. Recommendations will vary depending on your family history and other  risks. Genital exam to check for testicular cancer or hernias. Colorectal cancer screening. All adults should have this screening starting at age 15 and continuing until age 5. Your health care provider may recommend screening at age 40 if you are at increased risk. You will have tests every 1-10 years, depending on your results and the type of screening test. Diabetes screening. This is done by checking your blood sugar (glucose) after you have not eaten for a while (fasting). You may have this done every 1-3 years. STD (sexually transmitted disease) testing, if you are at risk. Follow these instructions at home: Eating and drinking  Eat a diet that includes fresh fruits and vegetables, whole grains, lean protein, and low-fat dairy products. Take vitamin and mineral supplements as recommended by your health care provider. Do not drink alcohol if your health care provider tells you not to drink. If you drink alcohol: Limit how much you have to 0-2 drinks a day. Be aware of how much alcohol is in your drink. In the [place] one drink equals one 12 oz bottle of beer (355 mL), one 5 oz glass of wine (148 mL), or one 1 oz glass of hard liquor (44 mL).  Lifestyle Take daily care of your teeth and gums. Brush your teeth every morning and night with fluoride toothpaste. Floss one time each day. Stay active. Exercise for at least 30 minutes 5 or more days each week. Do not use any products that contain nicotine or tobacco, such as cigarettes, e-cigarettes, and chewing tobacco. If you need help quitting, ask your health care provider. Do not use drugs. If you are sexually active, practice safe sex. Use a condom or other form of protection to prevent  STIs (sexually transmitted infections). If told by your health care provider, take low-dose aspirin daily starting at age 65. Find healthy ways to cope with stress, such as: Meditation, yoga, or listening to music. Journaling. Talking to a trusted  person. Spending time with friends and family. Safety Always wear your seat belt while driving or riding in a vehicle. Do not drive: If you have been drinking alcohol. Do not ride with someone who has been drinking. When you are tired or distracted. While texting. Wear a helmet and other protective equipment during sports activities. If you have firearms in your house, make sure you follow all gun safety procedures. What's next? Go to your health care provider once a year for an annual wellness visit. Ask your health care provider how often you should have your eyes and teeth checked. Stay up to date on all vaccines. This information is not intended to replace advice given to you by your health care provider. Make sure you discuss any questions you have with your healthcare provider. Document Revised: 03/29/2019 Document Reviewed: 06/24/2018 Elsevier Patient Education  2022 [employer].

## 2020-12-25 NOTE — Progress Notes (Signed)
Derek Brewer - 45 y.o. male MRN [id number]  Date of birth: 1976/05/28  Subjective Chief Complaint  Patient presents with   Annual Exam    HPI Derek Brewer is a 45 y.o. male here today for annual exam.  He had recent perianal abscess that was drained in the ED a few days ago.Marland Kitchen  He needs to have packing removed.    He denies any other changes to his health.   He does smoke a few cigarettes per day.  He consumes EtOH occasionally.  He stays pretty active and exercises several days per week.  He feels like his diet is pretty good.   Review of Systems  Constitutional:  Negative for chills, fever, malaise/fatigue and weight loss.  HENT:  Negative for congestion, ear pain and sore throat.   Eyes:  Negative for blurred vision, double vision and pain.  Respiratory:  Negative for cough and shortness of breath.   Cardiovascular:  Negative for chest pain and palpitations.  Gastrointestinal:  Negative for abdominal pain, blood in stool, constipation, heartburn and nausea.  Genitourinary:  Negative for dysuria and urgency.  Musculoskeletal:  Negative for joint pain and myalgias.  Neurological:  Negative for dizziness and headaches.  Endo/Heme/Allergies:  Does not bruise/bleed easily.  Psychiatric/Behavioral:  Negative for depression. The patient is not nervous/anxious and does not have insomnia.    Allergies  Allergen Reactions   Panadol Cold-Flu [Phenylephrine-Acetaminophen] Other (See Comments)   Tylenol Cold & Flu Day-Night [Pe-Dm-Gg-Apap&Pe-Doxyl-Dm-Apap] Other (See Comments)    dizzy    Past Medical History:  Diagnosis Date   Cervical radiculopathy    Cyst of buttocks 01/2016   left buttock    Dental crowns present    x 2   Mixed hyperlipidemia 12/20/2018   Tobacco use     Past Surgical History:  Procedure Laterality Date   ACHILLES TENDON REPAIR Left    CYST EXCISION Left 01/24/2016   Procedure: EXCISION LEFT BUTTOCK CYST;  Surgeon: Coralie Keens, MD;  Location: [facility];  Service: General;  Laterality: Left;   EPIDURAL BLOCK INJECTION  2020   cervical   KNEE ARTHROSCOPY WITH PATELLAR TENDON REPAIR Right     Social History   Socioeconomic History   Marital status: Married    Spouse name: Not on file   Number of children: Not on file   Years of education: Not on file   Highest education level: Not on file  Occupational History   Not on file  Tobacco Use   Smoking status: Light Smoker    Years: 25.00    Pack years: 0.00    Types: Cigarettes   Smokeless tobacco: Never   Tobacco comments:    1 pack/week  Vaping Use   Vaping Use: Former  Substance and Sexual Activity   Alcohol use: Yes    Comment: 2 drinks per week   Drug use: Never   Sexual activity: Yes    Partners: Female    Comment: one partner  Other Topics Concern   Not on file  Social History Narrative   Not on file   Social Determinants of Health   Financial Resource Strain: Not on file  Food Insecurity: Not on file  Transportation Needs: Not on file  Physical Activity: Not on file  Stress: Not on file  Social Connections: Not on file    Family History  Problem Relation Age of Onset   Healthy Mother    Lung cancer Maternal Grandfather  Hypertension Maternal Grandmother    Stroke Maternal Grandmother     Health Maintenance  Topic Date Due   Pneumococcal Vaccine 96-27 Years old (1 - PCV) Never done   HIV Screening  Never done   Hepatitis C Screening  Never done   INFLUENZA VACCINE  02/11/2021   Zoster Vaccines- Shingrix (1 of 2) 10/16/2026   TETANUS/TDAP  04/06/2029   COVID-19 Vaccine  Completed   HPV VACCINES  Aged Out     ----------------------------------------------------------------------------------------------------------------------------------------------------------------------------------------------------------------- Physical Exam BP 135/75 (BP Location: Left Arm, Patient Position: Sitting, Cuff Size: Large)   Pulse 91   Temp  98.4 F (36.9 C)   Ht 5\' 11"  (1.803 m)   Wt 175 lb 4.8 oz (79.5 kg)   SpO2 99%   BMI 24.45 kg/m   Physical Exam Constitutional:      General: He is not in acute distress. HENT:     Head: Normocephalic and atraumatic.     Right Ear: External ear normal.     Left Ear: External ear normal.  Eyes:     General: No scleral icterus. Neck:     Thyroid: No thyromegaly.  Cardiovascular:     Rate and Rhythm: Normal rate and regular rhythm.     Heart sounds: Normal heart sounds.  Pulmonary:     Effort: Pulmonary effort is normal.     Breath sounds: Normal breath sounds.  Abdominal:     General: Bowel sounds are normal. There is no distension.     Palpations: Abdomen is soft.     Tenderness: There is no abdominal tenderness. There is no guarding.  Musculoskeletal:     Cervical back: Normal range of motion.  Lymphadenopathy:     Cervical: No cervical adenopathy.  Skin:    General: Skin is warm and dry.     Findings: No rash.     Comments: Site of I&D with minimal induration.  Packing removed.  No active drainage.    Neurological:     Mental Status: He is alert and oriented to person, place, and time.     Cranial Nerves: No cranial nerve deficit.     Motor: No abnormal muscle tone.  Psychiatric:        Behavior: Behavior normal.    ------------------------------------------------------------------------------------------------------------------------------------------------------------------------------------------------------------------- Assessment and Plan  No problem-specific Assessment & Plan notes found for this encounter.   No orders of the defined types were placed in this encounter.   No follow-ups on file.    This visit occurred during the SARS-CoV-2 public health emergency.  Safety protocols were in place, including screening questions prior to the visit, additional usage of staff PPE, and extensive cleaning of exam room while observing appropriate contact time  as indicated for disinfecting solutions.

## 2020-12-25 NOTE — Assessment & Plan Note (Signed)
Well adult Orders Placed This Encounter  Procedures  . Basic Metabolic Panel (BMET)  . Lipid Panel w/reflex Direct LDL  Screening: Lipid panel Immunizations: UTD Anticipatory guidance/Risk factor reduction:  Counseled on smoking cessation. Additional recommendations per AVS

## 2020-12-26 LAB — BASIC METABOLIC PANEL
BUN: 15 mg/dL (ref 7–25)
CO2: 29 mmol/L (ref 20–32)
Calcium: 9.1 mg/dL (ref 8.6–10.3)
Chloride: 104 mmol/L (ref 98–110)
Creat: 1.19 mg/dL (ref 0.60–1.35)
Glucose, Bld: 113 mg/dL — ABNORMAL HIGH (ref 65–99)
Potassium: 3.9 mmol/L (ref 3.5–5.3)
Sodium: 140 mmol/L (ref 135–146)

## 2020-12-26 LAB — LIPID PANEL W/REFLEX DIRECT LDL
Cholesterol: 167 mg/dL (ref <200)
HDL: 62 mg/dL (ref >=40)
LDL Cholesterol (Calc): 80 mg/dL (calc)
Non-HDL Cholesterol (Calc): 105 mg/dL (calc) (ref <130)
Total CHOL/HDL Ratio: 2.7 (calc) (ref <5.0)
Triglycerides: 148 mg/dL (ref <150)

## 2021-01-07 ENCOUNTER — Other Ambulatory Visit: Payer: Self-pay | Admitting: Family Medicine

## 2021-01-07 DIAGNOSIS — E782 Mixed hyperlipidemia: Secondary | ICD-10-CM

## 2021-07-04 ENCOUNTER — Other Ambulatory Visit: Payer: Self-pay | Admitting: Family Medicine

## 2021-07-04 DIAGNOSIS — E782 Mixed hyperlipidemia: Secondary | ICD-10-CM

## 2021-10-23 ENCOUNTER — Emergency Department: Admit: 2021-10-23 | Discharge status: Absent without leave | Payer: Self-pay

## 2021-10-23 ENCOUNTER — Ambulatory Visit: Payer: Self-pay

## 2021-10-23 ENCOUNTER — Emergency Department
Admission: EM | Admit: 2021-10-23 | Discharge: 2021-10-23 | Disposition: A | Discharge status: Home / Self Care | Payer: BC Managed Care – PPO | Source: Home / Self Care

## 2021-10-23 DIAGNOSIS — J020 Streptococcal pharyngitis: Secondary | ICD-10-CM

## 2021-10-23 LAB — POCT RAPID STREP A (OFFICE): Rapid Strep A Screen: POSITIVE — AB

## 2021-10-23 MED ORDER — PENICILLIN V POTASSIUM 500 MG PO TABS: 500.0000 mg | ORAL_TABLET | Freq: Three times a day (TID) | ORAL | 0 refills | Status: AC

## 2021-10-23 MED ORDER — PREDNISONE 20 MG PO TABS: ORAL_TABLET | ORAL | 0 refills | Status: DC

## 2021-10-23 NOTE — ED Triage Notes (Signed)
Pt states that he has had 2 negative covid. Test. 4/12 and 4/11. ?

## 2021-10-23 NOTE — Discharge Instructions (Addendum)
Advised patient to take medications as directed with food to completion.  Instructed patient to take Prednisone with first dose of Pen-V-K for the next 5 of 7 days.  Encouraged patient to increase daily water intake while taking these medications since.  Advised patient if symptoms worsen and/or unresolved please follow-up with PCP or here for further evaluation. ?

## 2021-10-23 NOTE — ED Provider Notes (Signed)
?[facility] ? ? ? ?CSN: [id number] ?Arrival date & time: 10/23/21  0830 ? ? ?  ? ?History   ?Chief Complaint ?Chief Complaint  ?Patient presents with  ? Sore Throat  ?  Sore throat. X2 days  ? ? ?HPI ?MAYLON SAILORS is a 46 y.o. male.  ? ?HPI 46 year old male presents with sore throat for 2 days reports very painful when swallowing.  Reports negative COVID-19 test on 4/11 and 10/23/21. ? ?Past Medical History:  ?Diagnosis Date  ? Cervical radiculopathy   ? Cyst of buttocks 01/2016  ? left buttock   ? Dental crowns present   ? x 2  ? Mixed hyperlipidemia 12/20/2018  ? Tobacco use   ? ? ?Patient Active Problem List  ? Diagnosis Date Noted  ? Perianal abscess 12/25/2020  ? Well adult exam 12/19/2019  ? Mixed hyperlipidemia 12/20/2018  ? 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 Light tobacco smoker 12/16/2018  ? Cervical radiculopathy   ? ? ?Past Surgical History:  ?Procedure Laterality Date  ? ACHILLES TENDON REPAIR Left   ? CYST EXCISION Left 01/24/2016  ? Procedure: EXCISION LEFT BUTTOCK CYST;  Surgeon: Coralie Keens, MD;  Location: [facility];  Service: General;  Laterality: Left;  ? EPIDURAL BLOCK INJECTION  2020  ? cervical  ? KNEE ARTHROSCOPY WITH PATELLAR TENDON REPAIR Right   ? ? ? ? ? ?Home Medications   ? ?Prior to Admission medications   ?Medication Sig Start Date End Date Taking? Authorizing Provider  ?atorvastatin (LIPITOR) 10 MG tablet TAKE 1 TABLET BY MOUTH EVERY DAY 07/04/21  Yes Breeback, Jade L, PA-C  ?Multiple Vitamin (MULTIVITAMIN) tablet Take 1 tablet by mouth daily.   Yes [provider]  ?penicillin v potassium (VEETID) 500 MG tablet Take 1 tablet (500 mg total) by mouth 3 (three) times daily for 7 days. 10/23/21 10/30/21 Yes Eliezer Lofts, FNP  ?predniSONE (DELTASONE) 20 MG tablet Take 3 tabs PO daily x 5 days. 10/23/21  Yes Eliezer Lofts, FNP  ?HYDROcodone-acetaminophen (NORCO) 10-325 MG tablet Take 1 tablet by mouth every 6 (six) hours as needed.    [provider]  ? ? ?Family History ?Family History  ?Problem Relation Age of Onset  ? Healthy Mother   ? Lung cancer Maternal Grandfather   ? Hypertension Maternal Grandmother   ? Stroke Maternal Grandmother   ? ? ?Social History ?Social History  ? ?Tobacco Use  ? Smoking status: Light Smoker  ?  Years: 25.00  ?  Types: Cigarettes  ? Smokeless tobacco: Never  ? Tobacco comments:  ?  1 pack/week  ?Vaping Use  ? Vaping Use: Former  ?Substance Use Topics  ? Alcohol use: Yes  ?  Comment: 2 drinks per week  ? Drug use: Never  ? ? ? ?Allergies   ?Panadol cold-flu [phenylephrine-acetaminophen] and Tylenol cold & flu day-night [pe-dm-gg-apap&pe-doxyl-dm-apap] ? ? ?Review of Systems ?Review of Systems  ?HENT:  Positive for sore throat.   ?All other systems reviewed and are negative. ? ? ?Physical Exam ?Triage Vital Signs ?ED Triage Vitals  ?Enc Vitals Group  ?   BP 10/23/21 0846 117/79  ?   Pulse Rate 10/23/21 0846 86  ?   Resp 10/23/21 0846 18  ?   Temp 10/23/21 0846 98.6 ?F (37 ?C)  ?   Temp Source 10/23/21 0846 Oral  ?   SpO2 10/23/21 0846 98 %  ?   Weight 10/23/21 0844 180 lb (81.6  kg)  ?   Height 10/23/21 0844 '5\' 11"'$  (1.803 m)  ?   Head Circumference --   ?   Peak Flow --   ?   Pain Score 10/23/21 0844 8  ?   Pain Loc --   ?   Pain Edu? --   ?   Excl. in [place]? --   ? ?No data found. ? ?Updated Vital Signs ?BP 117/79 (BP Location: Right Arm)   Pulse 86   Temp 98.6 ?F (37 ?C) (Oral)   Resp 18   Ht '5\' 11"'$  (1.803 m)   Wt 180 lb (81.6 kg)   SpO2 98%   BMI 25.10 kg/m?  ? ? ? ?Physical Exam ?Vitals and nursing note reviewed.  ?Constitutional:   ?   General: He is not in acute distress. ?   Appearance: He is well-developed and normal weight. He is not ill-appearing.  ?HENT:  ?   Head: Normocephalic and atraumatic.  ?   Right Ear: Tympanic membrane and ear canal normal.  ?   Left Ear: Tympanic membrane and ear canal normal.  ?   Mouth/Throat:  ?   Mouth: Mucous membranes are moist.  ?   Pharynx: Oropharynx is clear. Uvula  midline. No posterior oropharyngeal erythema or uvula swelling.  ?Eyes:  ?   Conjunctiva/sclera: Conjunctivae normal.  ?   Pupils: Pupils are equal, round, and reactive to light.  ?Cardiovascular:  ?   Rate and Rhythm: Normal rate and regular rhythm.  ?   Heart sounds: Normal heart sounds. No murmur heard. ?Pulmonary:  ?   Effort: Pulmonary effort is normal.  ?   Breath sounds: Normal breath sounds. No wheezing, rhonchi or rales.  ?Musculoskeletal:  ?   Cervical back: Normal range of motion and neck supple. Tenderness present.  ?Lymphadenopathy:  ?   Cervical: Cervical adenopathy present.  ?Skin: ?   General: Skin is warm and dry.  ?Neurological:  ?   General: No focal deficit present.  ?   Mental Status: He is alert and oriented to person, place, and time.  ? ? ? ?UC Treatments / Results  ?Labs ?(all labs ordered are listed, but only abnormal results are displayed) ?Labs Reviewed  ?POCT RAPID STREP A (OFFICE) - Abnormal; Notable for the following components:  ?    Result Value  ? Rapid Strep A Screen Positive (*)   ? All other components within normal limits  ? ? ?EKG ? ? ?Radiology ?No results found. ? ?Procedures ?Procedures (including critical care time) ? ?Medications Ordered in UC ?Medications - No data to display ? ?Initial Impression / Assessment and Plan / UC Course  ?I have reviewed the triage vital signs and the nursing notes. ? ?Pertinent labs & imaging results that were available during my care of the patient were reviewed by me and considered in my medical decision making (see chart for details). ? ?  ? ?Home: 1.  Strep pharyngitis-Rx'd Pen-V-K and prednisone. Advised patient to take medications as directed with food to completion.  Instructed patient to take Prednisone with first dose of Pen-V-K for the next 5 of 7 days.  Encouraged patient to increase daily water intake while taking these medications since.  Advised patient if symptoms worsen and/or unresolved please follow-up with PCP or here for  further evaluation.  Work note provided to patient prior to discharge. Patient discharged home, hemodynamically stable. ? ?Final Clinical Impressions(s) / UC Diagnoses  ? ?Final diagnoses:  ?Strep pharyngitis  ? ? ? ?  Discharge Instructions   ? ?  ?Advised patient to take medications as directed with food to completion.  Instructed patient to take Prednisone with first dose of Pen-V-K for the next 5 of 7 days.  Encouraged patient to increase daily water intake while taking these medications since.  Advised patient if symptoms worsen and/or unresolved please follow-up with PCP or here for further evaluation. ? ? ? ? ?ED Prescriptions   ? ? Medication Sig Dispense Auth. Provider  ? penicillin v potassium (VEETID) 500 MG tablet Take 1 tablet (500 mg total) by mouth 3 (three) times daily for 7 days. 21 tablet Eliezer Lofts, FNP  ? predniSONE (DELTASONE) 20 MG tablet Take 3 tabs PO daily x 5 days. 15 tablet Eliezer Lofts, FNP  ? ?  ? ?PDMP not reviewed this encounter. ?  ?Eliezer Lofts, [place] ?10/23/21 0949 ? ?

## 2021-10-23 NOTE — ED Triage Notes (Signed)
Pt states that he has a sore throat. Pt states that it hurts to eat and swallow. X2 days ? ? ?Pt states that he is vaccinated for covid.  ?Pt states that he has had flu vaccine.  ?

## 2021-12-23 ENCOUNTER — Encounter: Payer: Self-pay | Admitting: Family Medicine

## 2021-12-23 ENCOUNTER — Ambulatory Visit (INDEPENDENT_AMBULATORY_CARE_PROVIDER_SITE_OTHER): Payer: BC Managed Care – PPO | Admitting: Family Medicine

## 2021-12-23 VITALS — BP 122/64 | HR 82 | Ht 71.0 in | Wt 179.0 lb

## 2021-12-23 DIAGNOSIS — Z0000 Encounter for general adult medical examination without abnormal findings: Secondary | ICD-10-CM

## 2021-12-23 DIAGNOSIS — Z1211 Encounter for screening for malignant neoplasm of colon: Secondary | ICD-10-CM

## 2021-12-23 DIAGNOSIS — E782 Mixed hyperlipidemia: Secondary | ICD-10-CM | POA: Diagnosis not present

## 2021-12-23 NOTE — Progress Notes (Signed)
Derek Brewer - 46 y.o. male MRN [id number]  Date of birth: 1975-09-20  Subjective Chief Complaint  Patient presents with   Annual Exam    HPI Derek Brewer is a 46 y.o. male here today for annual exam.  He reports that he is doing well.   He reports that he does continue to exercise regularly.  Feels like diet remains pretty good most days.  He does smoke 2 to 3 cigarettes/day.  He has cut back since last year.  He has 1-2 alcoholic beverages per week.  He is due for colon cancer screening and would like referral for colonoscopy.  Review of Systems  Constitutional:  Negative for chills, fever, malaise/fatigue and weight loss.  HENT:  Negative for congestion, ear pain and sore throat.   Eyes:  Negative for blurred vision, double vision and pain.  Respiratory:  Negative for cough and shortness of breath.   Cardiovascular:  Negative for chest pain and palpitations.  Gastrointestinal:  Negative for abdominal pain, blood in stool, constipation, heartburn and nausea.  Genitourinary:  Negative for dysuria and urgency.  Musculoskeletal:  Negative for joint pain and myalgias.  Neurological:  Negative for dizziness and headaches.  Endo/Heme/Allergies:  Does not bruise/bleed easily.  Psychiatric/Behavioral:  Negative for depression. The patient is not nervous/anxious and does not have insomnia.     Allergies  Allergen Reactions   Panadol Cold-Flu [Phenylephrine-Acetaminophen] Other (See Comments)   Tylenol Cold & Flu Day-Night [Pe-Dm-Gg-Apap&Pe-Doxyl-Dm-Apap] Other (See Comments)    dizzy    Past Medical History:  Diagnosis Date   Cervical radiculopathy    Cyst of buttocks 01/2016   left buttock    Dental crowns present    x 2   Mixed hyperlipidemia 12/20/2018   Tobacco use     Past Surgical History:  Procedure Laterality Date   ACHILLES TENDON REPAIR Left    CYST EXCISION Left 01/24/2016   Procedure: EXCISION LEFT BUTTOCK CYST;  Surgeon: Coralie Keens, MD;  Location:  [facility];  Service: General;  Laterality: Left;   EPIDURAL BLOCK INJECTION  2020   cervical   KNEE ARTHROSCOPY WITH PATELLAR TENDON REPAIR Right     Social History   Socioeconomic History   Marital status: Married    Spouse name: Not on file   Number of children: Not on file   Years of education: Not on file   Highest education level: Not on file  Occupational History   Not on file  Tobacco Use   Smoking status: Light Smoker    Years: 25.00    Types: Cigarettes   Smokeless tobacco: Never   Tobacco comments:    1 pack/week  Vaping Use   Vaping Use: Former  Substance and Sexual Activity   Alcohol use: Yes    Comment: 2 drinks per week   Drug use: Never   Sexual activity: Yes    Partners: Female    Comment: one partner  Other Topics Concern   Not on file  Social History Narrative   Not on file   Social Determinants of Health   Financial Resource Strain: Not on file  Food Insecurity: Not on file  Transportation Needs: Not on file  Physical Activity: Not on file  Stress: Not on file  Social Connections: Not on file    Family History  Problem Relation Age of Onset   Healthy Mother    Lung cancer Maternal Grandfather    Hypertension Maternal Grandmother    Stroke  Maternal Grandmother     Health Maintenance  Topic Date Due   COVID-19 Vaccine (4 - Pfizer series) 03/25/2022 (Originally 08/23/2020)   COLONOSCOPY (Pts 45-42yr Insurance coverage will need to be confirmed)  12/24/2022 (Originally 10/15/2021)   Hepatitis C Screening  12/24/2022 (Originally 10/16/1994)   HIV Screening  12/24/2022 (Originally 10/16/1991)   INFLUENZA VACCINE  02/11/2022   TETANUS/TDAP  04/06/2029   HPV VACCINES  Aged Out     ----------------------------------------------------------------------------------------------------------------------------------------------------------------------------------------------------------------- Physical Exam BP 122/64 (BP Location:  Left Arm, Patient Position: Sitting, Cuff Size: Normal)   Pulse 82   Ht '5\' 11"'$  (1.803 m)   Wt 179 lb (81.2 kg)   SpO2 100%   BMI 24.97 kg/m   Physical Exam Constitutional:      General: He is not in acute distress. HENT:     Head: Normocephalic and atraumatic.     Right Ear: Tympanic membrane and external ear normal.     Left Ear: Tympanic membrane and external ear normal.  Eyes:     General: No scleral icterus. Neck:     Thyroid: No thyromegaly.  Cardiovascular:     Rate and Rhythm: Normal rate and regular rhythm.     Heart sounds: Normal heart sounds.  Pulmonary:     Effort: Pulmonary effort is normal.     Breath sounds: Normal breath sounds.  Abdominal:     General: Bowel sounds are normal. There is no distension.     Palpations: Abdomen is soft.     Tenderness: There is no abdominal tenderness. There is no guarding.  Musculoskeletal:     Cervical back: Normal range of motion.  Lymphadenopathy:     Cervical: No cervical adenopathy.  Skin:    General: Skin is warm and dry.     Findings: No rash.  Neurological:     Mental Status: He is alert and oriented to person, place, and time.     Cranial Nerves: No cranial nerve deficit.     Motor: No abnormal muscle tone.  Psychiatric:        Mood and Affect: Mood normal.        Behavior: Behavior normal.     ------------------------------------------------------------------------------------------------------------------------------------------------------------------------------------------------------------------- Assessment and Plan  Well adult exam Well adult Orders Placed This Encounter  Procedures   COMPLETE METABOLIC PANEL WITH GFR   CBC with Differential   Lipid Panel w/reflex Direct LDL   Ambulatory referral to Gastroenterology    Referral Priority:   Routine    Referral Type:   Consultation    Referral Reason:   Specialty Services Required    Number of Visits Requested:   1  Screening:  Per lab orders.   Referral placed for colon cancer screening Immunizations: UTD Anticipatory guidance/risk factor reduction: Counseled on smoking cessation.  Additional recommendations per AVS.   No orders of the defined types were placed in this encounter.   No follow-ups on file.    This visit occurred during the SARS-CoV-2 public health emergency.  Safety protocols were in place, including screening questions prior to the visit, additional usage of staff PPE, and extensive cleaning of exam room while observing appropriate contact time as indicated for disinfecting solutions.

## 2021-12-23 NOTE — Patient Instructions (Signed)

## 2021-12-23 NOTE — Assessment & Plan Note (Signed)
Well adult Orders Placed This Encounter  Procedures  . COMPLETE METABOLIC PANEL WITH GFR  . CBC with Differential  . Lipid Panel w/reflex Direct LDL  . Ambulatory referral to Gastroenterology    Referral Priority:   Routine    Referral Type:   Consultation    Referral Reason:   Specialty Services Required    Number of Visits Requested:   1  Screening:  Per lab orders.  Referral placed for colon cancer screening Immunizations: UTD Anticipatory guidance/risk factor reduction: Counseled on smoking cessation.  Additional recommendations per AVS.

## 2021-12-24 LAB — COMPLETE METABOLIC PANEL WITH GFR
AG Ratio: 1.8 (calc) (ref 1.0–2.5)
ALT: 18 U/L (ref 9–46)
AST: 17 U/L (ref 10–40)
Albumin: 4.3 g/dL (ref 3.6–5.1)
Alkaline phosphatase (APISO): 61 U/L (ref 36–130)
BUN: 11 mg/dL (ref 7–25)
CO2: 27 mmol/L (ref 20–32)
Calcium: 9.2 mg/dL (ref 8.6–10.3)
Chloride: 103 mmol/L (ref 98–110)
Creat: 1.13 mg/dL (ref 0.60–1.29)
Globulin: 2.4 g/dL (calc) (ref 1.9–3.7)
Glucose, Bld: 114 mg/dL — ABNORMAL HIGH (ref 65–99)
Potassium: 4 mmol/L (ref 3.5–5.3)
Sodium: 140 mmol/L (ref 135–146)
Total Bilirubin: 0.5 mg/dL (ref 0.2–1.2)
Total Protein: 6.7 g/dL (ref 6.1–8.1)
eGFR: 82 mL/min/{1.73_m2} (ref >=60)

## 2021-12-24 LAB — CBC WITH DIFFERENTIAL/PLATELET
Absolute Monocytes: 599 cells/uL (ref 200–950)
Basophils Absolute: 51 cells/uL (ref 0–200)
Basophils Relative: 0.7 %
Eosinophils Absolute: 774 cells/uL — ABNORMAL HIGH (ref 15–500)
Eosinophils Relative: 10.6 %
HCT: 45.4 % (ref 38.5–50.0)
Hemoglobin: 15.4 g/dL (ref 13.2–17.1)
Lymphs Abs: 2256 cells/uL (ref 850–3900)
MCH: 32.1 pg (ref 27.0–33.0)
MCHC: 33.9 g/dL (ref 32.0–36.0)
MCV: 94.6 fL (ref 80.0–100.0)
MPV: 11.8 fL (ref 7.5–12.5)
Monocytes Relative: 8.2 %
Neutro Abs: 3621 cells/uL (ref 1500–7800)
Neutrophils Relative %: 49.6 %
Platelets: 171 10*3/uL (ref 140–400)
RBC: 4.8 10*6/uL (ref 4.20–5.80)
RDW: 13.5 % (ref 11.0–15.0)
Total Lymphocyte: 30.9 %
WBC: 7.3 10*3/uL (ref 3.8–10.8)

## 2021-12-24 LAB — LIPID PANEL W/REFLEX DIRECT LDL
Cholesterol: 179 mg/dL (ref <200)
HDL: 70 mg/dL (ref >=40)
LDL Cholesterol (Calc): 84 mg/dL (calc)
Non-HDL Cholesterol (Calc): 109 mg/dL (calc) (ref <130)
Total CHOL/HDL Ratio: 2.6 (calc) (ref <5.0)
Triglycerides: 158 mg/dL — ABNORMAL HIGH (ref <150)

## 2021-12-25 ENCOUNTER — Other Ambulatory Visit: Payer: Self-pay | Admitting: Physician Assistant

## 2021-12-25 DIAGNOSIS — E782 Mixed hyperlipidemia: Secondary | ICD-10-CM

## 2022-01-10 ENCOUNTER — Encounter: Payer: Self-pay | Admitting: Family Medicine

## 2022-02-03 DIAGNOSIS — Z1211 Encounter for screening for malignant neoplasm of colon: Secondary | ICD-10-CM | POA: Diagnosis not present

## 2022-02-03 LAB — HM COLONOSCOPY

## 2022-02-13 ENCOUNTER — Encounter: Payer: Self-pay | Admitting: Family Medicine

## 2022-02-19 ENCOUNTER — Encounter: Payer: Self-pay | Admitting: Family Medicine

## 2022-02-19 ENCOUNTER — Ambulatory Visit: Payer: BC Managed Care – PPO | Admitting: Family Medicine

## 2022-02-19 DIAGNOSIS — M51369 Other intervertebral disc degeneration, lumbar region without mention of lumbar back pain or lower extremity pain: Secondary | ICD-10-CM | POA: Insufficient documentation

## 2022-02-19 DIAGNOSIS — M5136 Other intervertebral disc degeneration, lumbar region: Secondary | ICD-10-CM | POA: Insufficient documentation

## 2022-02-19 MED ORDER — CYCLOBENZAPRINE HCL 10 MG PO TABS: 10.0000 mg | ORAL_TABLET | Freq: Three times a day (TID) | ORAL | 0 refills | Status: DC | PRN

## 2022-02-19 MED ORDER — PREDNISONE 50 MG PO TABS: ORAL_TABLET | ORAL | 0 refills | Status: DC

## 2022-02-19 NOTE — Progress Notes (Signed)
Derek Brew[facility] - 46 y.o. male MRN [id number]  Date of birth: [birth year]  Subjective Chief Complaint  Patient presents with   thigh pain    HPI Derek Brewer is a 46 year old male here today with complaint of low back and leg pain.  Symptoms started the day following his colonoscopy.  Reports that he got out of bed and was stretching and noted pain in the right side of his back with radiation into the right leg.  He does have history of degenerative disc disease.  Pain in his back has improved however continues to have some pain down the right leg.  He denies numbness, tingling or weakness.  He does get some relief with naproxen.  No bowel or bladder dysfunction.  ROS:  A comprehensive ROS was completed and negative except as noted per HPI  Allergies  Allergen Reactions   Panadol Cold-Flu [Phenylephrine-Acetaminophen] Other (See Comments)   Tylenol Cold & Flu Day-Night [Pe-Dm-Gg-Apap&Pe-Doxyl-Dm-Apap] Other (See Comments)    dizzy    Past Medical History:  Diagnosis Date   Cervical radiculopathy    Cyst of buttocks 01/2016   left buttock    Dental crowns present    x 2   Mixed hyperlipidemia 12/20/2018   Tobacco use     Past Surgical History:  Procedure Laterality Date   ACHILLES TENDON REPAIR Left    CYST EXCISION Left 01/24/2016   Procedure: EXCISION LEFT BUTTOCK CYST;  Surgeon: Coralie Keens, MD;  Location: [facility];  Service: General;  Laterality: Left;   EPIDURAL BLOCK INJECTION  2020   cervical   KNEE ARTHROSCOPY WITH PATELLAR TENDON REPAIR Right     Social History   Socioeconomic History   Marital status: Married    Spouse name: Not on file   Number of children: Not on file   Years of education: Not on file   Highest education level: Not on file  Occupational History   Not on file  Tobacco Use   Smoking status: Light Smoker    Years: 25.00    Types: Cigarettes   Smokeless tobacco: Never   Tobacco comments:    1 pack/week  Vaping Use   Vaping  Use: Former  Substance and Sexual Activity   Alcohol use: Yes    Comment: 2 drinks per week   Drug use: Never   Sexual activity: Yes    Partners: Female    Comment: one partner  Other Topics Concern   Not on file  Social History Narrative   Not on file   Social Determinants of Health   Financial Resource Strain: Not on file  Food Insecurity: Not on file  Transportation Needs: Not on file  Physical Activity: Not on file  Stress: Not on file  Social Connections: Not on file    Family History  Problem Relation Age of Onset   Healthy Mother    Lung cancer Maternal Grandfather    Hypertension Maternal Grandmother    Stroke Maternal Grandmother     Health Maintenance  Topic Date Due   INFLUENZA VACCINE  02/11/2022   COVID-19 Vaccine (4 - Pfizer series) 03/25/2022 (Originally 08/23/2020)   COLONOSCOPY (Pts 45-36yr Insurance coverage will need to be confirmed)  12/24/2022 (Originally 10/15/2021)   Hepatitis C Screening  12/24/2022 (Originally 10/16/1994)   HIV Screening  12/24/2022 (Originally 10/16/1991)   TETANUS/TDAP  04/06/2029   HPV VACCINES  Aged Out     ----------------------------------------------------------------------------------------------------------------------------------------------------------------------------------------------------------------- Physical Exam BP (!) 122/90 (BP Location: Left Arm, Patient Position:  Sitting, Cuff Size: Large)   Pulse 84   Ht '5\' 11"'$  (1.803 m)   Wt 181 lb (82.1 kg)   SpO2 97%   BMI 25.24 kg/m   Physical Exam Constitutional:      Appearance: Normal appearance.  Eyes:     General: No scleral icterus. Musculoskeletal:     Cervical back: Neck supple.     Comments: Range of motion lumbar spine is normal.  No significant tenderness to palpation. Strength lower extremities is normal.  Neurological:     Mental Status: He is alert.  Psychiatric:        Mood and Affect: Mood normal.        Behavior: Behavior normal.      ------------------------------------------------------------------------------------------------------------------------------------------------------------------------------------------------------------------- Assessment and Plan  Degenerative disc disease, lumbar No red flags in history or on exam.  Adding course of prednisone 50 mg daily x 5 days.  Additionally, will add Flexeril as needed.  Given handout for home exercises.  He will let me know if not improving or if having worsening symptoms.   Meds ordered this encounter  Medications   predniSONE (DELTASONE) 50 MG tablet    Sig: Take 1 tab po daily x5 days.    Dispense:  5 tablet    Refill:  0   cyclobenzaprine (FLEXERIL) 10 MG tablet    Sig: Take 1 tablet (10 mg total) by mouth 3 (three) times daily as needed for muscle spasms.    Dispense:  30 tablet    Refill:  0    No follow-ups on file.    This visit occurred during the SARS-CoV-2 public health emergency.  Safety protocols were in place, including screening questions prior to the visit, additional usage of staff PPE, and extensive cleaning of exam room while observing appropriate contact time as indicated for disinfecting solutions.

## 2022-02-19 NOTE — Patient Instructions (Signed)
Start prednisone '50mg'$  daily x5 days.  Flexeril '10mg'$  up to three times per day as needed.  This may make you drowsy.   Try home exercises.   Let me know if not improving or worsening.

## 2022-02-19 NOTE — Assessment & Plan Note (Signed)
No red flags in history or on exam.  Adding course of prednisone 50 mg daily x 5 days.  Additionally, will add Flexeril as needed.  Given handout for home exercises.  He will let me know if not improving or if having worsening symptoms.

## 2022-03-12 ENCOUNTER — Ambulatory Visit: Payer: BC Managed Care – PPO | Admitting: Sports Medicine

## 2022-03-12 DIAGNOSIS — M5136 Other intervertebral disc degeneration, lumbar region: Secondary | ICD-10-CM

## 2022-03-12 DIAGNOSIS — M51369 Other intervertebral disc degeneration, lumbar region without mention of lumbar back pain or lower extremity pain: Secondary | ICD-10-CM

## 2022-03-12 NOTE — Progress Notes (Signed)
    Procedures performed today:    None.  Independent interpretation of notes and tests performed by another provider:   None.  Brief History, Exam, Impression, and Recommendations:    Degenerative disc disease, lumbar Pleasant 46 year old male, he is very active and very healthy, approximately 3 to 4 weeks ago he had some increasing low back pain, he saw his PCP and was treated appropriately with prednisone, Flexeril, he did really well, his pain is for the most part gone, on further questioning he had some right-sided low back pain with radiation down the right leg but not past the knee, worse with sitting, flexion, Valsalva all consistent with discogenic pain. His exam today is benign, normal hip rotation, negative straight leg raise, no areas of tenderness palpation, good motion, good strength and neurovascular intact distally. I gave him the spiel regarding lumbar DDD, anatomy and evolutionary anthropology. He will do his home conditioning, and return to see me on as-needed basis. As his symptoms have resolved no imaging needed today.    ____________________________________________ Gwen Her. Dianah Field, M.D., ABFM., CAQSM., AME. Primary Care and Sports Medicine [facility]  Adjunct Professor of [employer] Medicine  Risk manager

## 2022-03-12 NOTE — Assessment & Plan Note (Signed)
Pleasant 46 year old male, he is very active and very healthy, approximately 3 to 4 weeks ago he had some increasing low back pain, he saw his PCP and was treated appropriately with prednisone, Flexeril, he did really well, his pain is for the most part gone, on further questioning he had some right-sided low back pain with radiation down the right leg but not past the knee, worse with sitting, flexion, Valsalva all consistent with discogenic pain. His exam today is benign, normal hip rotation, negative straight leg raise, no areas of tenderness palpation, good motion, good strength and neurovascular intact distally. I gave him the spiel regarding lumbar DDD, anatomy and evolutionary anthropology. He will do his home conditioning, and return to see me on as-needed basis. As his symptoms have resolved no imaging needed today.

## 2022-12-17 ENCOUNTER — Other Ambulatory Visit: Payer: Self-pay | Admitting: Family Medicine

## 2022-12-17 DIAGNOSIS — E782 Mixed hyperlipidemia: Secondary | ICD-10-CM

## 2022-12-17 NOTE — Telephone Encounter (Signed)
Hold refill until 12/26/22 appt

## 2022-12-25 ENCOUNTER — Ambulatory Visit (INDEPENDENT_AMBULATORY_CARE_PROVIDER_SITE_OTHER): Payer: BC Managed Care – PPO | Admitting: Family Medicine

## 2022-12-25 ENCOUNTER — Encounter: Payer: Self-pay | Admitting: Family Medicine

## 2022-12-25 VITALS — BP 120/71 | HR 62 | Ht 71.0 in | Wt 189.0 lb

## 2022-12-25 DIAGNOSIS — R351 Nocturia: Secondary | ICD-10-CM | POA: Diagnosis not present

## 2022-12-25 DIAGNOSIS — E782 Mixed hyperlipidemia: Secondary | ICD-10-CM

## 2022-12-25 DIAGNOSIS — R7309 Other abnormal glucose: Secondary | ICD-10-CM | POA: Diagnosis not present

## 2022-12-25 DIAGNOSIS — H02823 Cysts of right eye, unspecified eyelid: Secondary | ICD-10-CM | POA: Insufficient documentation

## 2022-12-25 DIAGNOSIS — Z0000 Encounter for general adult medical examination without abnormal findings: Secondary | ICD-10-CM | POA: Diagnosis not present

## 2022-12-25 MED ORDER — ERYTHROMYCIN 5 MG/GM OP OINT: 1.0000 | TOPICAL_OINTMENT | Freq: Every day | OPHTHALMIC | 0 refills | Status: DC

## 2022-12-25 NOTE — Patient Instructions (Addendum)
Warm compresses to the eye 3-4x per day Try erythromycin ointment.  Let me know if not improving over the next 10-14 days or if worsening     Preventive Care 3-46 Years Old, Male Preventive care refers to lifestyle choices and visits with your health care provider that can promote health and wellness. Preventive care visits are also called wellness exams. What can I expect for my preventive care visit? Counseling During your preventive care visit, your health care provider may ask about your: Medical history, including: Past medical problems. Family medical history. Current health, including: Emotional well-being. Home life and relationship well-being. Sexual activity. Lifestyle, including: Alcohol, nicotine or tobacco, and drug use. Access to firearms. Diet, exercise, and sleep habits. Safety issues such as seatbelt and bike helmet use. Sunscreen use. Work and work Astronomer. Physical exam Your health care provider will check your: Height and weight. These may be used to calculate your BMI (body mass index). BMI is a measurement that tells if you are at a healthy weight. Waist circumference. This measures the distance around your waistline. This measurement also tells if you are at a healthy weight and may help predict your risk of certain diseases, such as type 2 diabetes and high blood pressure. Heart rate and blood pressure. Body temperature. Skin for abnormal spots. What immunizations do I need?  Vaccines are usually given at various ages, according to a schedule. Your health care provider will recommend vaccines for you based on your age, medical history, and lifestyle or other factors, such as travel or where you work. What tests do I need? Screening Your health care provider may recommend screening tests for certain conditions. This may include: Lipid and cholesterol levels. Diabetes screening. This is done by checking your blood sugar (glucose) after you have not  eaten for a while (fasting). Hepatitis B test. Hepatitis C test. HIV (human immunodeficiency virus) test. STI (sexually transmitted infection) testing, if you are at risk. Lung cancer screening. Prostate cancer screening. Colorectal cancer screening. Talk with your health care provider about your test results, treatment options, and if necessary, the need for more tests. Follow these instructions at home: Eating and drinking  Eat a diet that includes fresh fruits and vegetables, whole grains, lean protein, and low-fat dairy products. Take vitamin and mineral supplements as recommended by your health care provider. Do not drink alcohol if your health care provider tells you not to drink. If you drink alcohol: Limit how much you have to 0-2 drinks a day. Know how much alcohol is in your drink. In the [place] one drink equals one 12 oz bottle of beer (355 mL), one 5 oz glass of wine (148 mL), or one 1 oz glass of hard liquor (44 mL). Lifestyle Brush your teeth every morning and night with fluoride toothpaste. Floss one time each day. Exercise for at least 30 minutes 5 or more days each week. Do not use any products that contain nicotine or tobacco. These products include cigarettes, chewing tobacco, and vaping devices, such as e-cigarettes. If you need help quitting, ask your health care provider. Do not use drugs. If you are sexually active, practice safe sex. Use a condom or other form of protection to prevent STIs. Take aspirin only as told by your health care provider. Make sure that you understand how much to take and what form to take. Work with your health care provider to find out whether it is safe and beneficial for you to take aspirin daily. Find healthy ways  to manage stress, such as: Meditation, yoga, or listening to music. Journaling. Talking to a trusted person. Spending time with friends and family. Minimize exposure to UV radiation to reduce your risk of skin  cancer. Safety Always wear your seat belt while driving or riding in a vehicle. Do not drive: If you have been drinking alcohol. Do not ride with someone who has been drinking. When you are tired or distracted. While texting. If you have been using any mind-altering substances or drugs. Wear a helmet and other protective equipment during sports activities. If you have firearms in your house, make sure you follow all gun safety procedures. What's next? Go to your health care provider once a year for an annual wellness visit. Ask your health care provider how often you should have your eyes and teeth checked. Stay up to date on all vaccines. This information is not intended to replace advice given to you by your health care provider. Make sure you discuss any questions you have with your health care provider. Document Revised: 12/26/2020 Document Reviewed: 12/26/2020 Elsevier Patient Education  2024 [employer].

## 2022-12-25 NOTE — Assessment & Plan Note (Signed)
Well adult Orders Placed This Encounter  Procedures   COMPLETE METABOLIC PANEL WITH GFR   CBC with Differential   Lipid Panel w/reflex Direct LDL   HgB A1c   PSA  Screenings: per lab orderse Immunizations: UTD Anticipatory guidance/Risk factor reduction:  Recommendations per AVS.

## 2022-12-25 NOTE — Progress Notes (Signed)
Derek Brewer - 47 y.o. male MRN [id number]  Date of birth: 1975-11-14  Subjective Chief Complaint  Patient presents with   Annual Exam    HPI Derek Brewer is a 47 y.o. male here today for annual exam.   Continues on atorvastatin for HLD.  Toleraitng well.  Doing well overall.   He is moderately active.  He feels that diet is pretty good.   He does smoke occasionally.  Occasional EtOH use.   He has had colon cancer screening.   Review of Systems  Constitutional:  Negative for chills, fever, malaise/fatigue and weight loss.  HENT:  Negative for congestion, ear pain and sore throat.   Eyes:  Negative for blurred vision, double vision and pain.  Respiratory:  Negative for cough and shortness of breath.   Cardiovascular:  Negative for chest pain and palpitations.  Gastrointestinal:  Negative for abdominal pain, blood in stool, constipation, heartburn and nausea.  Genitourinary:  Negative for dysuria and urgency.  Musculoskeletal:  Negative for joint pain and myalgias.  Neurological:  Negative for dizziness and headaches.  Endo/Heme/Allergies:  Does not bruise/bleed easily.  Psychiatric/Behavioral:  Negative for depression. The patient is not nervous/anxious and does not have insomnia.     Allergies  Allergen Reactions   Panadol Cold-Flu [Phenylephrine-Acetaminophen] Other (See Comments)   Tylenol Cold & Flu Day-Night [Pe-Dm-Gg-Apap&Pe-Doxyl-Dm-Apap] Other (See Comments)    dizzy    Past Medical History:  Diagnosis Date   Cervical radiculopathy    Cyst of buttocks 01/2016   left buttock    Dental crowns present    x 2   Mixed hyperlipidemia 12/20/2018   Tobacco use     Past Surgical History:  Procedure Laterality Date   ACHILLES TENDON REPAIR Left    CYST EXCISION Left 01/24/2016   Procedure: EXCISION LEFT BUTTOCK CYST;  Surgeon: Abigail Miyamoto, MD;  Location: [facility];  Service: General;  Laterality: Left;   EPIDURAL BLOCK INJECTION  2020    cervical   KNEE ARTHROSCOPY WITH PATELLAR TENDON REPAIR Right     Social History   Socioeconomic History   Marital status: Married    Spouse name: Not on file   Number of children: Not on file   Years of education: Not on file   Highest education level: Not on file  Occupational History   Not on file  Tobacco Use   Smoking status: Light Smoker    Years: 25    Types: Cigarettes   Smokeless tobacco: Never   Tobacco comments:    1 pack/week  Vaping Use   Vaping Use: Former  Substance and Sexual Activity   Alcohol use: Yes    Comment: 2 drinks per week   Drug use: Never   Sexual activity: Yes    Partners: Female    Comment: one partner  Other Topics Concern   Not on file  Social History Narrative   Not on file   Social Determinants of Health   Financial Resource Strain: Not on file  Food Insecurity: Not on file  Transportation Needs: Not on file  Physical Activity: Not on file  Stress: Not on file  Social Connections: Not on file    Family History  Problem Relation Age of Onset   Healthy Mother    Lung cancer Maternal Grandfather    Hypertension Maternal Grandmother    Stroke Maternal Grandmother     Health Maintenance  Topic Date Due   HIV Screening  Never done  Hepatitis C Screening  Never done   COVID-19 Vaccine (4 - 2023-24 season) 01/10/2023 (Originally 03/14/2022)   INFLUENZA VACCINE  02/12/2023   DTaP/Tdap/Td (2 - Td or Tdap) 04/06/2029   Colonoscopy  02/04/2032   HPV VACCINES  Aged Out     ----------------------------------------------------------------------------------------------------------------------------------------------------------------------------------------------------------------- Physical Exam BP 120/71 (BP Location: Left Arm, Patient Position: Sitting, Cuff Size: Large)   Pulse 62   Ht 5\' 11"  (1.803 m)   Wt 189 lb (85.7 kg)   SpO2 100%   BMI 26.36 kg/m   Physical Exam Constitutional:      General: He is not in acute  distress. HENT:     Head: Normocephalic and atraumatic.     Right Ear: Tympanic membrane and external ear normal.     Left Ear: Tympanic membrane and external ear normal.  Eyes:     General: No scleral icterus.    Comments: Cyst vs chalazion of R upper lid  Neck:     Thyroid: No thyromegaly.  Cardiovascular:     Rate and Rhythm: Normal rate and regular rhythm.     Heart sounds: Normal heart sounds.  Pulmonary:     Effort: Pulmonary effort is normal.     Breath sounds: Normal breath sounds.  Abdominal:     General: Bowel sounds are normal. There is no distension.     Palpations: Abdomen is soft.     Tenderness: There is no abdominal tenderness. There is no guarding.  Musculoskeletal:     Cervical back: Normal range of motion.  Lymphadenopathy:     Cervical: No cervical adenopathy.  Skin:    General: Skin is warm and dry.     Findings: No rash.  Neurological:     Mental Status: He is alert and oriented to person, place, and time.     Cranial Nerves: No cranial nerve deficit.     Motor: No abnormal muscle tone.  Psychiatric:        Mood and Affect: Mood normal.        Behavior: Behavior normal.     ------------------------------------------------------------------------------------------------------------------------------------------------------------------------------------------------------------------- Assessment and Plan  Well adult exam Well adult Orders Placed This Encounter  Procedures   COMPLETE METABOLIC PANEL WITH GFR   CBC with Differential   Lipid Panel w/reflex Direct LDL   HgB A1c   PSA  Screenings: per lab orderse Immunizations: UTD Anticipatory guidance/Risk factor reduction:  Recommendations per AVS.   Cyst of eyelid, right Chalazion vs cyst.  Recommend continuation of warm compresses.  Erythromycin ointment added.  Contact if not improving in 10-14 days.    Meds ordered this encounter  Medications   erythromycin ophthalmic ointment    Sig:  Place 1 Application into the right eye at bedtime.    Dispense:  3.5 g    Refill:  0    No follow-ups on file.    This visit occurred during the SARS-CoV-2 public health emergency.  Safety protocols were in place, including screening questions prior to the visit, additional usage of staff PPE, and extensive cleaning of exam room while observing appropriate contact time as indicated for disinfecting solutions.

## 2022-12-25 NOTE — Assessment & Plan Note (Addendum)
Chalazion vs cyst.  Recommend continuation of warm compresses.  Erythromycin ointment added.  Contact if not improving in 10-14 days.

## 2022-12-26 LAB — COMPLETE METABOLIC PANEL WITH GFR
AG Ratio: 1.6 (calc) (ref 1.0–2.5)
ALT: 32 U/L (ref 9–46)
AST: 25 U/L (ref 10–40)
Albumin: 4.6 g/dL (ref 3.6–5.1)
Alkaline phosphatase (APISO): 59 U/L (ref 36–130)
BUN: 12 mg/dL (ref 7–25)
CO2: 28 mmol/L (ref 20–32)
Calcium: 9.4 mg/dL (ref 8.6–10.3)
Chloride: 99 mmol/L (ref 98–110)
Creat: 1.17 mg/dL (ref 0.60–1.29)
Globulin: 2.9 g/dL (calc) (ref 1.9–3.7)
Glucose, Bld: 91 mg/dL (ref 65–99)
Potassium: 4.1 mmol/L (ref 3.5–5.3)
Sodium: 136 mmol/L (ref 135–146)
Total Bilirubin: 0.6 mg/dL (ref 0.2–1.2)
Total Protein: 7.5 g/dL (ref 6.1–8.1)
eGFR: 78 mL/min/{1.73_m2} (ref >=60)

## 2022-12-26 LAB — CBC WITH DIFFERENTIAL/PLATELET
Absolute Monocytes: 778 cells/uL (ref 200–950)
Basophils Absolute: 69 cells/uL (ref 0–200)
Basophils Relative: 0.9 %
Eosinophils Absolute: 785 cells/uL — ABNORMAL HIGH (ref 15–500)
Eosinophils Relative: 10.2 %
HCT: 45.7 % (ref 38.5–50.0)
Hemoglobin: 15.3 g/dL (ref 13.2–17.1)
Lymphs Abs: 2895 cells/uL (ref 850–3900)
MCH: 31.1 pg (ref 27.0–33.0)
MCHC: 33.5 g/dL (ref 32.0–36.0)
MCV: 92.9 fL (ref 80.0–100.0)
MPV: 12 fL (ref 7.5–12.5)
Monocytes Relative: 10.1 %
Neutro Abs: 3172 cells/uL (ref 1500–7800)
Neutrophils Relative %: 41.2 %
Platelets: 165 10*3/uL (ref 140–400)
RBC: 4.92 10*6/uL (ref 4.20–5.80)
RDW: 13.5 % (ref 11.0–15.0)
Total Lymphocyte: 37.6 %
WBC: 7.7 10*3/uL (ref 3.8–10.8)

## 2022-12-26 LAB — LIPID PANEL W/REFLEX DIRECT LDL
Cholesterol: 205 mg/dL — ABNORMAL HIGH (ref <200)
HDL: 89 mg/dL (ref >=40)
LDL Cholesterol (Calc): 93 mg/dL (calc)
Non-HDL Cholesterol (Calc): 116 mg/dL (calc) (ref <130)
Total CHOL/HDL Ratio: 2.3 (calc) (ref <5.0)
Triglycerides: 126 mg/dL (ref <150)

## 2022-12-26 LAB — HEMOGLOBIN A1C
Hgb A1c MFr Bld: 5.5 % of total Hgb (ref <5.7)
Mean Plasma Glucose: 111 mg/dL
eAG (mmol/L): 6.2 mmol/L

## 2022-12-26 LAB — PSA: PSA: 0.55 ng/mL (ref <=4.00)

## 2022-12-26 MED ORDER — ATORVASTATIN CALCIUM 10 MG PO TABS: 10.0000 mg | ORAL_TABLET | Freq: Every day | ORAL | 3 refills | Status: DC

## 2023-01-01 ENCOUNTER — Encounter: Payer: Self-pay | Admitting: Family Medicine

## 2023-01-01 DIAGNOSIS — H0011 Chalazion right upper eyelid: Secondary | ICD-10-CM

## 2023-02-04 ENCOUNTER — Ambulatory Visit
Admission: RE | Admit: 2023-02-04 | Discharge: 2023-02-04 | Disposition: A | Discharge status: Home / Self Care | Payer: BC Managed Care – PPO | Source: Ambulatory Visit | Attending: Family Medicine | Admitting: Family Medicine

## 2023-02-04 ENCOUNTER — Ambulatory Visit: Payer: Self-pay

## 2023-02-04 VITALS — BP 121/77 | HR 81 | Temp 98.4°F | Resp 18 | Ht 71.0 in | Wt 185.0 lb

## 2023-02-04 DIAGNOSIS — J039 Acute tonsillitis, unspecified: Secondary | ICD-10-CM

## 2023-02-04 LAB — POCT RAPID STREP A (OFFICE): Rapid Strep A Screen: NEGATIVE

## 2023-02-04 MED ORDER — CEPHALEXIN 500 MG PO CAPS: 500.0000 mg | ORAL_CAPSULE | Freq: Two times a day (BID) | ORAL | 0 refills | Status: DC

## 2023-02-04 NOTE — ED Provider Notes (Signed)
[facility]    CSN: [id number] Arrival date & time: 02/04/23  0805      History   Chief Complaint Chief Complaint  Patient presents with   Sore Throat    HPI Derek Brewer is a 47 y.o. male.   HPI Patient has a very painful sore throat.  It has been present for 4 days, today is the fifth.  He also has a headache and some bodyaches.  No fever or chills.  No runny nose or congestion.  No cough.  No recent travel.  Does not suspect COVID.  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 Authorizing Provider  atorvastatin (LIPITOR) 10 MG tablet Take 1 tablet (10 mg total) by mouth daily. 12/26/22  Yes Everrett Coombe, DO  cephALEXin (KEFLEX) 500 MG capsule Take 1 capsule (500 mg total) by mouth 2 (two) times daily. 02/04/23  Yes Eustace Moore, MD  Cyanocobalamin (VITAMIN B-12 PO) Take by mouth.   Yes [provider]  Multiple Vitamin (MULTIVITAMIN) tablet Take 1 tablet by mouth daily.   Yes [provider]     Family History Family History  Problem Relation Age of Onset   Healthy Mother    Hypertension Maternal Grandmother    Stroke Maternal Grandmother    Lung cancer Maternal Grandfather     Social History Social History   Tobacco Use   Smoking status: Former    Types: Cigarettes   Smokeless tobacco: Never   Tobacco comments:    1 pack/week  Vaping Use   Vaping status: Former  Substance Use Topics   Alcohol use: Yes    Comment: 2 drinks per week   Drug use: Never     Allergies   Panadol cold-flu [phenylephrine-acetaminophen] and Tylenol cold & flu day-night [pe-dm-gg-apap&pe-doxyl-dm-apap]   Review of Systems Review of Systems See HPI  Physical Exam Triage Vital Signs ED Triage Vitals  Encounter Vitals Group     BP 02/04/23 0830 121/77     Systolic BP Percentile --      Diastolic BP Percentile --      Pulse Rate 02/04/23 0830 81     Resp 02/04/23 0830 18     Temp 02/04/23 0830 98.4 F (36.9 C)     Temp Source 02/04/23 0830 Oral     SpO2 02/04/23 0830 99 %     Weight 02/04/23 0828 185  lb (83.9 kg)     Height 02/04/23 0828 5\' 11"  (1.803 m)     Head Circumference --      Peak Flow --      Pain Score 02/04/23 0828 8     Pain Loc --      Pain Education --      Exclude from Growth Chart --    No data found.  Updated Vital Signs BP 121/77 (BP Location: Right Arm)   Pulse 81   Temp 98.4 F (36.9 C) (Oral)   Resp 18   Ht 5\' 11"  (1.803 m)   Wt 83.9 kg   SpO2 99%   BMI 25.80 kg/m       Physical Exam Constitutional:      General: He is not in acute distress.    Appearance: He is well-developed.  HENT:     Head: Normocephalic and atraumatic.     Right Ear: Tympanic membrane normal.     Left Ear: Tympanic membrane normal.     Nose: No congestion.     Mouth/Throat:     Mouth: Mucous membranes are moist.     Pharynx: Uvula midline. Pharyngeal swelling, posterior oropharyngeal erythema and uvula swelling present.     Tonsils: Tonsillar exudate  present. 3+ on the right. 3+ on the left.  Eyes:     Conjunctiva/sclera: Conjunctivae normal.     Pupils: Pupils are equal, round, and reactive to light.  Cardiovascular:     Rate and Rhythm: Normal rate.  Pulmonary:     Effort: Pulmonary effort is normal. No respiratory distress.  Abdominal:     General: There is no distension.     Palpations: Abdomen is soft.  Musculoskeletal:        General: Normal range of motion.     Cervical back: Normal range of motion.  Lymphadenopathy:     Cervical: Cervical adenopathy present.  Skin:    General: Skin is warm and dry.  Neurological:     Mental Status: He is alert.      UC Treatments / Results  Labs (all labs ordered are listed, but only abnormal results are displayed) Labs Reviewed  CULTURE, GROUP A STREP [facility])  POCT RAPID STREP A (OFFICE)    EKG   Radiology No results found.  Procedures Procedures (including critical care time)  Medications Ordered in UC Medications - No data to display  Initial Impression / Assessment and Plan / UC Course  I have reviewed the triage vital signs and the nursing notes.  Pertinent labs & imaging results that were available during my care of the patient were reviewed by me and considered in my medical decision making (see chart for details).     Throat culture has been sent to lab We will cover with antibiotics given the severity of his physical exam Final Clinical Impressions(s) / UC Diagnoses   Final diagnoses:  Tonsillitis     Discharge Instructions      Take the antibiotic 2 x a day Tylenol or advil/aleve for pain Call for problems   ED Prescriptions     Medication Sig Dispense Auth. Provider   cephALEXin (KEFLEX) 500 MG capsule Take 1 capsule (500 mg total) by mouth 2 (two) times daily. 14 capsule Eustace Moore, MD      PDMP not reviewed this encounter.   Eustace Moore, MD 02/04/23 [phone]

## 2023-02-04 NOTE — ED Triage Notes (Signed)
Patient c/o sore throat x 4 days, waking him up at night.  Some congestion, afebrile, body aches, mild headache.  Patient has been taken Aleve.

## 2023-02-04 NOTE — Discharge Instructions (Signed)
Take the antibiotic 2 x a day Tylenol or advil/aleve for pain Call for problems

## 2023-02-05 LAB — CULTURE, GROUP A STREP (THRC)

## 2023-02-06 DIAGNOSIS — H0011 Chalazion right upper eyelid: Secondary | ICD-10-CM | POA: Diagnosis not present

## 2023-07-28 DIAGNOSIS — M5412 Radiculopathy, cervical region: Secondary | ICD-10-CM | POA: Diagnosis not present

## 2023-08-17 DIAGNOSIS — L209 Atopic dermatitis, unspecified: Secondary | ICD-10-CM | POA: Diagnosis not present

## 2023-08-17 DIAGNOSIS — B360 Pityriasis versicolor: Secondary | ICD-10-CM | POA: Diagnosis not present

## 2023-08-28 DIAGNOSIS — M5412 Radiculopathy, cervical region: Secondary | ICD-10-CM | POA: Diagnosis not present

## 2023-08-28 DIAGNOSIS — M542 Cervicalgia: Secondary | ICD-10-CM | POA: Diagnosis not present

## 2023-09-03 DIAGNOSIS — M542 Cervicalgia: Secondary | ICD-10-CM | POA: Diagnosis not present

## 2023-09-15 DIAGNOSIS — M5022 Other cervical disc displacement, mid-cervical region, unspecified level: Secondary | ICD-10-CM | POA: Diagnosis not present

## 2023-09-15 DIAGNOSIS — M542 Cervicalgia: Secondary | ICD-10-CM | POA: Diagnosis not present

## 2023-09-15 DIAGNOSIS — M5412 Radiculopathy, cervical region: Secondary | ICD-10-CM | POA: Diagnosis not present

## 2023-09-23 DIAGNOSIS — M5412 Radiculopathy, cervical region: Secondary | ICD-10-CM | POA: Diagnosis not present

## 2023-10-09 DIAGNOSIS — M5412 Radiculopathy, cervical region: Secondary | ICD-10-CM | POA: Diagnosis not present

## 2023-10-19 ENCOUNTER — Other Ambulatory Visit: Payer: Self-pay | Admitting: Family Medicine

## 2023-10-19 DIAGNOSIS — E782 Mixed hyperlipidemia: Secondary | ICD-10-CM

## 2023-11-05 ENCOUNTER — Encounter: Payer: Self-pay | Admitting: Family Medicine

## 2023-11-05 ENCOUNTER — Ambulatory Visit: Payer: Self-pay

## 2023-11-05 ENCOUNTER — Ambulatory Visit: Admitting: Family Medicine

## 2023-11-05 VITALS — BP 125/66 | HR 70 | Ht 71.0 in | Wt 205.0 lb

## 2023-11-05 DIAGNOSIS — E010 Iodine-deficiency related diffuse (endemic) goiter: Secondary | ICD-10-CM | POA: Diagnosis not present

## 2023-11-05 DIAGNOSIS — M62511 Muscle wasting and atrophy, not elsewhere classified, right shoulder: Secondary | ICD-10-CM | POA: Insufficient documentation

## 2023-11-05 DIAGNOSIS — H1132 Conjunctival hemorrhage, left eye: Secondary | ICD-10-CM

## 2023-11-05 DIAGNOSIS — M75101 Unspecified rotator cuff tear or rupture of right shoulder, not specified as traumatic: Secondary | ICD-10-CM | POA: Insufficient documentation

## 2023-11-05 NOTE — Telephone Encounter (Signed)
 Patient has appt today with DR. Metheney

## 2023-11-05 NOTE — Progress Notes (Signed)
 Pt reports that he woke up yesterday morning and noticed that he had a busted blood vessel in the outer L corner of his eye. He states that the eye does burn and feels dry.   He denies any injury,trauma,hard cough,sneezing,or rubbing of eye. He does not wear contacts. Pt stated that he had his eyes examined on Monday and because he doesn't like to have his eyes dilated they do alternative testing for him. He stated that the doctor had actually placed the incorrect drops in his eyes but immediately flushed the drops out of his eyes. He denies any issues afterwards.

## 2023-11-05 NOTE — Patient Instructions (Signed)
 Please schedule with Dr. Sandy Crumb for your right shoulder

## 2023-11-05 NOTE — Progress Notes (Signed)
 Established Patient Office Visit  Subjective  Patient ID: Derek Brewer, male    DOB: 1976/06/09  Age: 48 y.o. MRN: [id number]  Chief Complaint  Patient presents with   Eye Problem    Busted blood vessel L eye    HPI  Pt reports that he woke up yesterday morning and noticed that he had a busted blood vessel in the outer L corner of his eye. He states that the eye does burn occasional and feels dry.    He denies any injury,trauma,hard cough,sneezing,or rubbing of eye. He does not wear contacts. Pt stated that he had his eyes examined on Monday and because he doesn't like to have his eyes dilated they do alternative testing for him. He stated that the doctor had actually placed the incorrect drops in his eyes but immediately flushed the drops out of his eyes. He denies any issues afterwards.   He does typically use Visine in the mornings to help with redness in his eyes he has not tried any new products.  Also noticed a lump on his right upper outer arm.  He says that his left shoulder Skechers a lot larger than his right sometimes he gets weakness in that arm to the point where it is difficult for him to pick up a 10 pound dumbbell then he has to work aggressively to get his strength back up.  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 Jackee Brewer for that in the past but is not sure if that is related or not.  He just knows that sometimes he will get pain from his neck that radiates towards his shoulder.    ROS    Objective:     BP 125/66   Pulse 70   Ht 5\' 11"  (1.803 m)   Wt 205 lb (93 kg)   SpO2 100%   BMI 28.59 kg/m    Physical Exam Constitutional:      Appearance: Normal appearance.  HENT:     Head: Normocephalic and atraumatic.     Right Ear: Tympanic membrane, ear canal and external ear normal. There is no impacted cerumen.     Left Ear: Tympanic membrane, ear canal and external ear normal. There is no impacted cerumen.     Nose: Nose normal.      Mouth/Throat:     Pharynx: Oropharynx is clear.  Eyes:     Extraocular Movements: Extraocular movements intact.     Conjunctiva/sclera: Conjunctivae normal.     Pupils: Pupils are equal, round, and reactive to light.     Comments: Has blood on the lateral lower corner of the left eye.   Neck:     Comments: Asymmetry to thyroid, larger on the right.  Cardiovascular:     Rate and Rhythm: Normal rate and regular rhythm.  Pulmonary:     Effort: Pulmonary effort is normal.     Breath sounds: Normal breath sounds.  Musculoskeletal:     Cervical back: Neck supple. No tenderness.  Lymphadenopathy:     Cervical: No cervical adenopathy.  Skin:    General: Skin is warm and dry.  Neurological:     Mental Status: He is alert and oriented to person, place, and time.  Psychiatric:        Mood and Affect: Mood normal.      No results found for any visits on 11/05/23.    The 10-year ASCVD risk score (Arnett DK, et al., 2019) is: 3.7%  Assessment & Plan:   Problem List Items Addressed This Visit       Musculoskeletal and Integument   Atrophy of muscle of right shoulder   Other Visit Diagnoses       Thyromegaly    -  Primary   Relevant Orders   US  THYROID     Burst blood vessel of left eye          Blood vessel burst in left eye-gave reassurance.  He has not had any vision problems.  Normal pupil reaction and extraocular movements intact.  We discussed that it should resolve on its own in about a week but if he develops new or worsening symptoms or blurry vision please let us  know immediately.  Thyroid was somewhat asymmetric on exam so like to get further evaluation to make sure that it is okay.  And to evaluate for any nodules.  He also has significant asymmetry between his right and left shoulders.  He did have an old shoulder injury previously, but also has what sounds like some more chronic cervical radicular issues and wondering if that could even be causing some atrophy  in the right shoulder.  Will schedule with Derek Brewer for further workup.  No follow-ups on file.    Derek German, MD

## 2023-11-05 NOTE — Telephone Encounter (Signed)
  Chief Complaint: eye problem  Symptoms: L eye redness on outer part of sclera, stinging sensation at times Frequency: yesterday morning  Pertinent Negatives: Patient denies pain or swelling  Disposition: [] ED /[] Urgent Care (no appt availability in office) / [x] Appointment(In office/virtual)/ []  [facility]/ [] Home Care/ [] Refused Recommended Disposition /[] [facility]/ []  Follow-up with PCP Additional Notes: pt states woke up yesterday morning with these sx. Denies irritation or discomfort, has some light sensitivity at times and stinging sensation. Scheduled OV today at 400 with Dr. Greer Leak.   Copied from CRM [id number]. Topic: Clinical - Pink Word Triage >> Nov 05, 2023  9:29 AM Arlie Benedict B wrote: Patient's spouse called in stating that the patient just woke up and appears to have a broken blood vessel in the eye, no pain, no swelling as of right now. She was wanting to make an appt but I advised to have his speak with the nurse and  have the nurse assist him with some possible options. Reason for Disposition  Bleeding on white of the eye  Answer Assessment - Initial Assessment Questions 1. LOCATION: Location: "What's red, the eyeball or the outer eyelids?" (Note: when callers say the eye is red, they usually mean the sclera is red)       L eye  2. REDNESS OF SCLERA: "Is the redness in one or both eyes?" "When did the redness start?"      L eye outer part of white  3. ONSET: "When did the eye become red?" (e.g., hours, days)      Yesterday  4. EYELIDS: "Are the eyelids red or swollen?" If Yes, ask: "How much?"      no 5. VISION: "Is there any difficulty seeing clearly?"      No vision changes  6. ITCHING: "Does it feel itchy?" If so ask: "How bad is it" (e.g., Scale 1-10; or mild, moderate, severe)     At times, from allergies  7. PAIN: "Is there any pain? If Yes, ask: "How bad is it?" (e.g., Scale 1-10; or mild, moderate, severe)     Sting at time  8. CONTACT  LENS: "Do you wear contacts?"     no 9. CAUSE: "What do you think is causing the redness?"     Busted blood vessel  10. OTHER SYMPTOMS: "Do you have any other symptoms?" (e.g., fever, runny nose, cough, vomiting)       no  Protocols used: Eye - Red Without Pus-A-AH

## 2023-11-10 ENCOUNTER — Ambulatory Visit

## 2023-11-10 DIAGNOSIS — E010 Iodine-deficiency related diffuse (endemic) goiter: Secondary | ICD-10-CM

## 2023-11-10 DIAGNOSIS — E041 Nontoxic single thyroid nodule: Secondary | ICD-10-CM

## 2023-11-10 DIAGNOSIS — R221 Localized swelling, mass and lump, neck: Secondary | ICD-10-CM | POA: Diagnosis not present

## 2023-11-13 ENCOUNTER — Ambulatory Visit: Admitting: Sports Medicine

## 2023-11-13 DIAGNOSIS — M62511 Muscle wasting and atrophy, not elsewhere classified, right shoulder: Secondary | ICD-10-CM

## 2023-11-13 DIAGNOSIS — M75101 Unspecified rotator cuff tear or rupture of right shoulder, not specified as traumatic: Secondary | ICD-10-CM

## 2023-11-13 NOTE — Progress Notes (Addendum)
    Procedures performed today:    None.  Independent interpretation of notes and tests performed by another provider:   None.  Brief History, Exam, Impression, and Recommendations:    Rotator cuff tear, right Very pleasant and previously healthy 48 year old male with history of right cervical radiculopathy that responded well to epidurals. More recently he has had increasing pain and weakness right shoulder with difficulty Abducting. No overt trauma. On exam he has positive impingement sign's, positive Neer's, Hawkins, positive empty can sign with severe weakness, weakness to external rotation, negative liftoff test, positive O'Brien test, positive speeds test, negative Yergason test I do suspect he has either a large rotator cuff tear or more of a denervation type picture such as Parsonage-Turner syndrome. I would like to go ahead and pull the trigger for the x-ray and MRI, follow-up and disposition will be dependent on MRI results. Patient declines analgesics.  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 Yvonne Hering to weigh in considering his weakness on exam.    ____________________________________________ Joselyn Nicely. Sandy Crumb, M.D., ABFM., CAQSM., AME. Primary Care and Sports Medicine [facility]  Adjunct Professor of Medical [facility] Medicine  [employer]  Restaurant manager, fast food

## 2023-11-13 NOTE — Assessment & Plan Note (Addendum)
 Very pleasant and previously healthy 48 year old male with history of right cervical radiculopathy that responded well to epidurals. More recently he has had increasing pain and weakness right shoulder with difficulty Abducting. No overt trauma. On exam he has positive impingement sign's, positive Neer's, Hawkins, positive empty can sign with severe weakness, weakness to external rotation, negative liftoff test, positive O'Brien test, positive speeds test, negative Yergason test I do suspect he has either a large rotator cuff tear or more of a denervation type picture such as Parsonage-Turner syndrome. I would like to go ahead and pull the trigger for the x-ray and MRI, follow-up and disposition will be dependent on MRI results. Patient declines analgesics.  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 Yvonne Hering to weigh in considering his weakness on exam.

## 2023-11-16 ENCOUNTER — Ambulatory Visit

## 2023-11-16 ENCOUNTER — Encounter: Payer: Self-pay | Admitting: Family Medicine

## 2023-11-16 DIAGNOSIS — M25711 Osteophyte, right shoulder: Secondary | ICD-10-CM | POA: Diagnosis not present

## 2023-11-16 DIAGNOSIS — R531 Weakness: Secondary | ICD-10-CM | POA: Diagnosis not present

## 2023-11-16 DIAGNOSIS — M25511 Pain in right shoulder: Secondary | ICD-10-CM | POA: Diagnosis not present

## 2023-11-16 DIAGNOSIS — M62511 Muscle wasting and atrophy, not elsewhere classified, right shoulder: Secondary | ICD-10-CM

## 2023-11-16 DIAGNOSIS — M19011 Primary osteoarthritis, right shoulder: Secondary | ICD-10-CM | POA: Diagnosis not present

## 2023-11-16 NOTE — Progress Notes (Signed)
 Hi Derek Brewer, just wanted to let you know that your ultrasound of your thyroid  actually looked okay they did not note any specific cysts.  But there was a small lymph node just underneath the jaw on the left side which is a little bit enlarged it was measuring 1.8 x 1.5 cm.  They felt like it was a reactive lymph node which means sometimes a lymph node can swell if you have a virus or a cold or a temporary infection.  But they did recommend that we recheck that lymph node in 3 months just to make sure that it has not gotten larger or that it has gotten smaller.

## 2023-11-23 ENCOUNTER — Encounter: Payer: Self-pay | Admitting: Sports Medicine

## 2023-11-23 ENCOUNTER — Ambulatory Visit

## 2023-11-23 ENCOUNTER — Encounter (INDEPENDENT_AMBULATORY_CARE_PROVIDER_SITE_OTHER): Payer: Self-pay | Admitting: Sports Medicine

## 2023-11-23 DIAGNOSIS — M75101 Unspecified rotator cuff tear or rupture of right shoulder, not specified as traumatic: Secondary | ICD-10-CM | POA: Diagnosis not present

## 2023-11-23 DIAGNOSIS — M25512 Pain in left shoulder: Secondary | ICD-10-CM | POA: Diagnosis not present

## 2023-11-23 DIAGNOSIS — M62511 Muscle wasting and atrophy, not elsewhere classified, right shoulder: Secondary | ICD-10-CM

## 2023-11-23 DIAGNOSIS — M129 Arthropathy, unspecified: Secondary | ICD-10-CM | POA: Diagnosis not present

## 2023-11-23 DIAGNOSIS — G8929 Other chronic pain: Secondary | ICD-10-CM | POA: Diagnosis not present

## 2023-11-23 DIAGNOSIS — M25511 Pain in right shoulder: Secondary | ICD-10-CM | POA: Diagnosis not present

## 2023-11-23 DIAGNOSIS — M542 Cervicalgia: Secondary | ICD-10-CM

## 2023-11-23 DIAGNOSIS — M7581 Other shoulder lesions, right shoulder: Secondary | ICD-10-CM | POA: Diagnosis not present

## 2023-11-23 DIAGNOSIS — E785 Hyperlipidemia, unspecified: Secondary | ICD-10-CM | POA: Diagnosis not present

## 2023-11-23 MED ORDER — TRAMADOL HCL 50 MG PO TABS: 50.0000 mg | ORAL_TABLET | Freq: Three times a day (TID) | ORAL | 0 refills | Status: AC | PRN

## 2023-11-23 MED ORDER — MELOXICAM 15 MG PO TABS: ORAL_TABLET | ORAL | 3 refills | Status: AC

## 2023-11-23 NOTE — Telephone Encounter (Signed)

## 2023-12-02 NOTE — Telephone Encounter (Signed)
 MRI has not resulted  Would you like us  to contact radiology to have this expedited ?

## 2023-12-02 NOTE — Telephone Encounter (Signed)
 Yes, and actually whenever people call about it go ahead and just have them move the MRI or CT read to stat, you do not have to ask me.

## 2023-12-03 ENCOUNTER — Encounter: Payer: Self-pay | Admitting: Family Medicine

## 2023-12-03 ENCOUNTER — Ambulatory Visit: Payer: Self-pay | Admitting: Sports Medicine

## 2023-12-03 NOTE — Addendum Note (Signed)
 Addended by: Gean Keels on: 12/03/2023 12:46 PM   Modules accepted: Orders

## 2023-12-03 NOTE — Telephone Encounter (Signed)
 Called radiology reading room and requested imaging be moved up for reading .

## 2023-12-09 DIAGNOSIS — M75111 Incomplete rotator cuff tear or rupture of right shoulder, not specified as traumatic: Secondary | ICD-10-CM | POA: Diagnosis not present

## 2023-12-18 DIAGNOSIS — M5412 Radiculopathy, cervical region: Secondary | ICD-10-CM | POA: Diagnosis not present

## 2023-12-28 ENCOUNTER — Encounter: Payer: Self-pay | Admitting: Family Medicine

## 2023-12-28 ENCOUNTER — Ambulatory Visit (INDEPENDENT_AMBULATORY_CARE_PROVIDER_SITE_OTHER): Payer: BC Managed Care – PPO | Admitting: Family Medicine

## 2023-12-28 VITALS — BP 131/74 | HR 75 | Ht 71.0 in | Wt 200.0 lb

## 2023-12-28 DIAGNOSIS — E782 Mixed hyperlipidemia: Secondary | ICD-10-CM | POA: Diagnosis not present

## 2023-12-28 DIAGNOSIS — Z0000 Encounter for general adult medical examination without abnormal findings: Secondary | ICD-10-CM

## 2023-12-28 DIAGNOSIS — Z125 Encounter for screening for malignant neoplasm of prostate: Secondary | ICD-10-CM

## 2023-12-28 NOTE — Assessment & Plan Note (Signed)
 Well adult Orders Placed This Encounter  Procedures   CMP14+EGFR   CBC with Differential/Platelet   Lipid Panel With LDL/HDL Ratio   PSA  Screenings: per lab orderse Immunizations: UTD Anticipatory guidance/Risk factor reduction:  Recommendations per AVS.

## 2023-12-28 NOTE — Patient Instructions (Signed)

## 2023-12-28 NOTE — Progress Notes (Signed)
 Derek Brewer - 48 y.o. male MRN [id number]  Date of birth: 01-12-1976  Subjective Chief Complaint  Patient presents with   Annual Exam    HPI Derek Brewer is a 48 y.o. male here today for annual exam.   He has seen Dr. Sandy Crumb for shoulder pain.  Had MRI showing severe supraspinatus tendinosis with partial thickness tear and has been referred to ortho.  He also made appt with neurosurgery and turns out he has herniated disc causing majority of symptoms.  He will be having this surgery first prior to having rotator cuff repair.   He continues to stay pretty active.  He feels that diet is pretty good.   He is a non-smoker.  Occasional EtOH.    Review of Systems  Constitutional:  Negative for chills, fever, malaise/fatigue and weight loss.  HENT:  Negative for congestion, ear pain and sore throat.   Eyes:  Negative for blurred vision, double vision and pain.  Respiratory:  Negative for cough and shortness of breath.   Cardiovascular:  Negative for chest pain and palpitations.  Gastrointestinal:  Negative for abdominal pain, blood in stool, constipation, heartburn and nausea.  Genitourinary:  Negative for dysuria and urgency.  Musculoskeletal:  Negative for joint pain and myalgias.  Neurological:  Negative for dizziness and headaches.  Endo/Heme/Allergies:  Does not bruise/bleed easily.  Psychiatric/Behavioral:  Negative for depression. The patient is not nervous/anxious and does not have insomnia.      Allergies  Allergen Reactions   Panadol Cold-Flu [Phenylephrine-Acetaminophen ] Other (See Comments)   Tylenol  Cold & Flu Day-Night [Pe-Dm-Gg-Apap&Pe-Doxyl-Dm-Apap] Other (See Comments)    dizzy    Past Medical History:  Diagnosis Date   Cervical radiculopathy    Cyst of buttocks 01/2016   left buttock    Dental crowns present    x 2   Mixed hyperlipidemia 12/20/2018   Tobacco use     Past Surgical History:  Procedure Laterality Date   ACHILLES TENDON REPAIR Left     CYST EXCISION Left 01/24/2016   Procedure: EXCISION LEFT BUTTOCK CYST;  Surgeon: Oza Blumenthal, MD;  Location: [facility];  Service: General;  Laterality: Left;   EPIDURAL BLOCK INJECTION  2020   cervical   KNEE ARTHROSCOPY WITH PATELLAR TENDON REPAIR Right     Social History   Socioeconomic History   Marital status: Married    Spouse name: Not on file   Number of children: Not on file   Years of education: Not on file   Highest education level: Not on file  Occupational History   Not on file  Tobacco Use   Smoking status: Former    Types: Cigarettes   Smokeless tobacco: Never   Tobacco comments:    1 pack/week  Vaping Use   Vaping status: Former  Substance and Sexual Activity   Alcohol use: Yes    Comment: 2 drinks per week   Drug use: Never   Sexual activity: Yes    Partners: Female    Comment: one partner  Other Topics Concern   Not on file  Social History Narrative   Not on file   Social Drivers of Health   Financial Resource Strain: Not on file  Food Insecurity: No Food Insecurity (12/25/2020)   Received from [facility]   Hunger Vital Sign    Within the past 12 months, you worried that your food would run out before you got the money to buy more.: Never true  Within the past 12 months, the food you bought just didn't last and you didn't have money to get more.: Never true  Transportation Needs: Not on file  Physical Activity: Not on file  Stress: Not on file  Social Connections: Unknown (11/26/2021)   [facility] from [facility]   Social Network    Social Network: Not on file    Family History  Problem Relation Age of Onset   Healthy Mother    Hypertension Maternal Grandmother    Stroke Maternal Grandmother    Lung cancer Maternal Grandfather     Health Maintenance  Topic Date Due   HIV Screening  Never done   Hepatitis C Screening  Never done   INFLUENZA VACCINE  02/12/2024   DTaP/Tdap/Td (2 - Td or Tdap) 04/06/2029    Colonoscopy  02/04/2032   COVID-19 Vaccine  Completed   HPV VACCINES  Aged Out   Meningococcal B Vaccine  Aged Out     ----------------------------------------------------------------------------------------------------------------------------------------------------------------------------------------------------------------- Physical Exam BP 131/74 (BP Location: Left Arm, Patient Position: Sitting, Cuff Size: Large)   Pulse 75   Ht 5' 11 (1.803 m)   Wt 200 lb (90.7 kg)   SpO2 100%   BMI 27.89 kg/m   Physical Exam Constitutional:      General: He is not in acute distress. HENT:     Head: Normocephalic and atraumatic.     Right Ear: Tympanic membrane and external ear normal.     Left Ear: Tympanic membrane and external ear normal.   Eyes:     General: No scleral icterus.  Neck:     Thyroid : No thyromegaly.   Cardiovascular:     Rate and Rhythm: Normal rate and regular rhythm.     Heart sounds: Normal heart sounds.  Pulmonary:     Effort: Pulmonary effort is normal.     Breath sounds: Normal breath sounds.  Abdominal:     General: Bowel sounds are normal. There is no distension.     Palpations: Abdomen is soft.     Tenderness: There is no abdominal tenderness. There is no guarding.   Musculoskeletal:     Cervical back: Normal range of motion.  Lymphadenopathy:     Cervical: No cervical adenopathy.   Skin:    General: Skin is warm and dry.     Findings: No rash.   Neurological:     Mental Status: He is alert and oriented to person, place, and time.     Cranial Nerves: No cranial nerve deficit.     Motor: No abnormal muscle tone.   Psychiatric:        Mood and Affect: Mood normal.        Behavior: Behavior normal.     ------------------------------------------------------------------------------------------------------------------------------------------------------------------------------------------------------------------- Assessment and Plan  Well  adult exam Well adult Orders Placed This Encounter  Procedures   CMP14+EGFR   CBC with Differential/Platelet   Lipid Panel With LDL/HDL Ratio   PSA  Screenings: per lab orderse Immunizations: UTD Anticipatory guidance/Risk factor reduction:  Recommendations per AVS.    No orders of the defined types were placed in this encounter.   No follow-ups on file.

## 2023-12-31 DIAGNOSIS — Z0000 Encounter for general adult medical examination without abnormal findings: Secondary | ICD-10-CM | POA: Diagnosis not present

## 2023-12-31 DIAGNOSIS — Z125 Encounter for screening for malignant neoplasm of prostate: Secondary | ICD-10-CM | POA: Diagnosis not present

## 2023-12-31 DIAGNOSIS — E782 Mixed hyperlipidemia: Secondary | ICD-10-CM | POA: Diagnosis not present

## 2024-01-01 LAB — CBC WITH DIFFERENTIAL/PLATELET
Basophils Absolute: 0.1 10*3/uL (ref 0.0–0.2)
Basos: 1 %
EOS (ABSOLUTE): 0.6 10*3/uL — ABNORMAL HIGH (ref 0.0–0.4)
Eos: 8 %
Hematocrit: 45 % (ref 37.5–51.0)
Hemoglobin: 14.8 g/dL (ref 13.0–17.7)
Immature Grans (Abs): 0 10*3/uL (ref 0.0–0.1)
Immature Granulocytes: 0 %
Lymphocytes Absolute: 3 10*3/uL (ref 0.7–3.1)
Lymphs: 40 %
MCH: 31.2 pg (ref 26.6–33.0)
MCHC: 32.9 g/dL (ref 31.5–35.7)
MCV: 95 fL (ref 79–97)
Monocytes Absolute: 0.6 10*3/uL (ref 0.1–0.9)
Monocytes: 8 %
Neutrophils Absolute: 3.2 10*3/uL (ref 1.4–7.0)
Neutrophils: 43 %
Platelets: 191 10*3/uL (ref 150–450)
RBC: 4.74 x10E6/uL (ref 4.14–5.80)
RDW: 13.6 % (ref 11.6–15.4)
WBC: 7.4 10*3/uL (ref 3.4–10.8)

## 2024-01-01 LAB — CMP14+EGFR
ALT: 33 IU/L (ref 0–44)
AST: 28 IU/L (ref 0–40)
Albumin: 4.6 g/dL (ref 4.1–5.1)
Alkaline Phosphatase: 77 IU/L (ref 44–121)
BUN/Creatinine Ratio: 9 (ref 9–20)
BUN: 13 mg/dL (ref 6–24)
Bilirubin Total: 0.4 mg/dL (ref 0.0–1.2)
CO2: 22 mmol/L (ref 20–29)
Calcium: 9.5 mg/dL (ref 8.7–10.2)
Chloride: 103 mmol/L (ref 96–106)
Creatinine, Ser: 1.39 mg/dL — ABNORMAL HIGH (ref 0.76–1.27)
Globulin, Total: 2.4 g/dL (ref 1.5–4.5)
Glucose: 91 mg/dL (ref 70–99)
Potassium: 4.4 mmol/L (ref 3.5–5.2)
Sodium: 139 mmol/L (ref 134–144)
Total Protein: 7 g/dL (ref 6.0–8.5)
eGFR: 63 mL/min/{1.73_m2} (ref >59)

## 2024-01-01 LAB — LIPID PANEL WITH LDL/HDL RATIO
Cholesterol, Total: 199 mg/dL (ref 100–199)
HDL: 84 mg/dL (ref >39)
LDL Chol Calc (NIH): 89 mg/dL (ref 0–99)
LDL/HDL Ratio: 1.1 ratio (ref 0.0–3.6)
Triglycerides: 152 mg/dL — ABNORMAL HIGH (ref 0–149)
VLDL Cholesterol Cal: 26 mg/dL (ref 5–40)

## 2024-01-01 LAB — PSA: Prostate Specific Ag, Serum: 0.7 ng/mL (ref 0.0–4.0)

## 2024-01-08 ENCOUNTER — Ambulatory Visit: Payer: Self-pay | Admitting: Family Medicine

## 2024-01-11 DIAGNOSIS — M5412 Radiculopathy, cervical region: Secondary | ICD-10-CM | POA: Diagnosis not present

## 2024-01-12 DIAGNOSIS — M4802 Spinal stenosis, cervical region: Secondary | ICD-10-CM | POA: Diagnosis not present

## 2024-01-12 DIAGNOSIS — M5412 Radiculopathy, cervical region: Secondary | ICD-10-CM | POA: Diagnosis not present

## 2024-01-27 DIAGNOSIS — M4802 Spinal stenosis, cervical region: Secondary | ICD-10-CM | POA: Diagnosis not present

## 2024-02-23 DIAGNOSIS — M4802 Spinal stenosis, cervical region: Secondary | ICD-10-CM | POA: Diagnosis not present

## 2024-03-15 ENCOUNTER — Encounter: Payer: Self-pay | Admitting: Sports Medicine

## 2024-03-26 DIAGNOSIS — L501 Idiopathic urticaria: Secondary | ICD-10-CM | POA: Diagnosis not present

## 2024-03-26 DIAGNOSIS — L209 Atopic dermatitis, unspecified: Secondary | ICD-10-CM | POA: Diagnosis not present

## 2024-04-07 ENCOUNTER — Other Ambulatory Visit: Payer: Self-pay | Admitting: Family Medicine

## 2024-04-07 DIAGNOSIS — E782 Mixed hyperlipidemia: Secondary | ICD-10-CM

## 2024-04-08 DIAGNOSIS — M4802 Spinal stenosis, cervical region: Secondary | ICD-10-CM | POA: Diagnosis not present

## 2024-04-15 DIAGNOSIS — M75111 Incomplete rotator cuff tear or rupture of right shoulder, not specified as traumatic: Secondary | ICD-10-CM | POA: Diagnosis not present

## 2024-05-10 DIAGNOSIS — M75111 Incomplete rotator cuff tear or rupture of right shoulder, not specified as traumatic: Secondary | ICD-10-CM | POA: Diagnosis not present

## 2024-05-10 DIAGNOSIS — M7581 Other shoulder lesions, right shoulder: Secondary | ICD-10-CM | POA: Diagnosis not present

## 2024-05-10 DIAGNOSIS — M7541 Impingement syndrome of right shoulder: Secondary | ICD-10-CM | POA: Diagnosis not present

## 2024-05-10 DIAGNOSIS — M7551 Bursitis of right shoulder: Secondary | ICD-10-CM | POA: Diagnosis not present

## 2024-05-10 DIAGNOSIS — G8918 Other acute postprocedural pain: Secondary | ICD-10-CM | POA: Diagnosis not present

## 2024-05-10 DIAGNOSIS — M24111 Other articular cartilage disorders, right shoulder: Secondary | ICD-10-CM | POA: Diagnosis not present

## 2024-05-24 ENCOUNTER — Ambulatory Visit: Attending: Surgical | Admitting: Rehabilitative and Restorative Service Providers"

## 2024-05-24 ENCOUNTER — Other Ambulatory Visit: Payer: Self-pay

## 2024-05-24 ENCOUNTER — Encounter: Payer: Self-pay | Admitting: Rehabilitative and Restorative Service Providers"

## 2024-05-24 DIAGNOSIS — M6281 Muscle weakness (generalized): Secondary | ICD-10-CM | POA: Diagnosis not present

## 2024-05-24 DIAGNOSIS — R293 Abnormal posture: Secondary | ICD-10-CM | POA: Diagnosis not present

## 2024-05-24 DIAGNOSIS — M25511 Pain in right shoulder: Secondary | ICD-10-CM | POA: Insufficient documentation

## 2024-05-24 NOTE — Therapy (Signed)
 OUTPATIENT PHYSICAL THERAPY UPPER EXTREMITY EVALUATION   Patient Name: Derek Brewer MRN: [id number] DOB:1975/12/16, 48 y.o., male Today's Date: 05/24/2024  END OF SESSION:  PT End of Session - 05/24/24 0936     Visit Number 1    Number of Visits 16    Date for Recertification  07/23/24    Authorization Type BCBS 2025 (CAL YEAR)  *NO AUTH REQUIRED PER [employer]  OOP MAX HAS BEEN MET  26 VISITS FOR THE YEAR ALLOWED WITHOUT PRIOR AUTH  PER [employer]  05/23/2024  TLL    Authorization Time Period check that this is calendar year?    Authorization - Visit Number 1    Authorization - Number of Visits 26    PT Start Time 0935    PT Stop Time 1015    PT Time Calculation (min) 40 min    Activity Tolerance Patient tolerated treatment well    Behavior During Therapy WFL for tasks assessed/performed          Past Medical History:  Diagnosis Date   Cervical radiculopathy    Cyst of buttocks 01/2016   left buttock    Dental crowns present    x 2   Mixed hyperlipidemia 12/20/2018   Tobacco use    Past Surgical History:  Procedure Laterality Date   ACHILLES TENDON REPAIR Left    CYST EXCISION Left 01/24/2016   Procedure: EXCISION LEFT BUTTOCK CYST;  Surgeon: Vicenta Poli, MD;  Location: [facility];  Service: General;  Laterality: Left;   EPIDURAL BLOCK INJECTION  2020   cervical   KNEE ARTHROSCOPY WITH PATELLAR TENDON REPAIR Right    Patient Active Problem List   Diagnosis Date Noted   Rotator cuff tear, right 11/05/2023   Cyst of eyelid, right 12/25/2022   Degenerative disc disease, lumbar 02/19/2022   Perianal abscess 12/25/2020   Well adult exam 12/19/2019   Mixed hyperlipidemia 12/20/2018   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 Dozier, MD)  REFERRING DIAG: ICD-10 Z98.890 Other specified postprocedural states  THERAPY DIAG:  Muscle  weakness (generalized)  Acute pain of right shoulder  Abnormal posture  Rationale for Evaluation and Treatment: Rehabilitation  ONSET DATE: 05/10/24  SUBJECTIVE:                                                                                                                                                                                      SUBJECTIVE STATEMENT: The patient got significant pain in the R shoulder in May and learned he had  a R supraspinatus tear. He also had atrophy in the shoulder and was found to have c-spine compression and underwent ACDF in July for his neck. He underwent R RTC on 05/10/24.   Hand dominance: Left  PERTINENT HISTORY: ACDF 01/12/24 cervical decompression surgery C3-C5, hyperlipidemia  PAIN:  Are you having pain? Yes: NPRS scale: soreness Pain location: R shoulder Pain description: soreness Aggravating factors: none Relieving factors: none  PRECAUTIONS: Shoulder-- follow protocol  RED FLAGS: None   WEIGHT BEARING RESTRICTIONS: Yes no loading per protocol  FALLS:  Has patient fallen in last 6 months? 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   PATIENT SURVEYS :  Quick Dash: =47.7%  COGNITION: Overall cognitive status: Within functional limits for tasks assessed     SENSATION: WFL  POSTURE: Notable atrophy in R anterior and middle deltoid *has recent cervical decompression  UPPER EXTREMITY ROM:  *only within allowed ROM per protocol  Passive ROM Right eval Left eval  Shoulder flexion 90  degrees PROM WNLs   Shoulder extension    Shoulder abduction    Shoulder adduction    Shoulder internal rotation    Shoulder external rotation To 20 degrees PROM   Elbow flexion Full AROM   Elbow extension Full AROM   Wrist flexion    Wrist extension    Wrist ulnar deviation    Wrist radial deviation    Wrist pronation    Wrist supination    (Blank rows = not tested)  UPPER EXTREMITY MMT: MMT Right eval Left eval  Shoulder flexion    Shoulder extension    Shoulder abduction    Shoulder adduction    Shoulder internal rotation    Shoulder external rotation    Middle trapezius    Lower trapezius    Elbow flexion >3/5   Elbow extension >3/5   Wrist flexion >3/5   Wrist extension >3/5   Wrist ulnar deviation    Wrist radial deviation    Wrist pronation    Wrist supination    Grip strength (lbs)    (Blank rows = not tested)                                                                                                                          [facility] Adult PT Treatment:  DATE: 05/24/24 Therapeutic Exercise: See HEP Self Care: Reviewed precautions using sling for sleep and when not doing exercises Reviewed ROM limitations and how PROM is allowed right now-- no active movement  PATIENT EDUCATION: Education details: HEP, plan of care, precautions, protocol progresison Person educated: Patient Education method: Explanation, Demonstration, and Handouts Education comprehension: verbalized understanding, returned demonstration, and needs further education  HOME EXERCISE PROGRAM: Access Code: [email or web address] URL: [email or web address] Date: 05/24/2024 Prepared by: Tawni Ferrier  Exercises - Circular Shoulder Pendulum with Table Support  - 2 x daily - 7 x weekly - 1 sets - 10 reps - Standing Scapular Retraction  - 2 x daily - 7 x weekly - 1 sets - 10 reps - Standing Elbow Flexion Extension AROM  - 2 x daily - 7 x  weekly - 1 sets - 10 reps - Standing Forearm Pronation and Supination AROM  - 2 x daily - 7 x weekly - 1 sets - 10 reps - Wrist AROM Flexion Extension  - 2 x daily - 7 x weekly - 1 sets - 10 reps - seated ball squeeze  - 2 x daily - 7 x weekly - 1 sets - 10 reps  ASSESSMENT:  CLINICAL IMPRESSION: Patient is a 48 y.o. male who was seen today for physical therapy evaluation and treatment for s/p R rotator cuff repair. Of note, he also had atrophy around the R shoulder -- most visible today in anterior deltoid (C5-C6). He had cervical decompression surgery in July prior to shoulder surgery. He presents with no limitations from neck demonstrating  functional ROM with c-spine. Patient has been performing home AAROM to 90 deg flexion with R shoulder at instruction from referring provider. At today's evaluation, he presents with impairments of dec'd ROM, dec'd strength, atrophy of shoulder musculature R shoulder. PT to work to restore full ROM and return to lifting activities and wellness/strengthening routine.   OBJECTIVE IMPAIRMENTS: decreased ROM, decreased strength, hypomobility, increased fascial restrictions, impaired flexibility, postural dysfunction, and pain.   ACTIVITY LIMITATIONS: lifting and reach over head  PARTICIPATION LIMITATIONS: cleaning, occupation, and yard work  PERSONAL FACTORS: 1-2 comorbidities: hyperlipidemia, ACDF surgery in July 2025 are also affecting patient's functional outcome.   REHAB POTENTIAL: Good  CLINICAL DECISION MAKING: Evolving/moderate complexity  EVALUATION COMPLEXITY: Moderate  GOALS: Goals reviewed with patient? Yes  SHORT TERM GOALS: Target date: 06/22/24  The patient will be indep with initial HEP Baseline: initiated at eval Goal status: INITIAL  2.  The patient will improve PROM to 120 degrees, ER to 40 degrees  Baseline:  per protcol will be able to advance in 2 more weeks (06/07/24) to more ROM--see above Goal status: INITIAL  3.  The  patient will demonstrate AAROM without evidence of shoulder shrug. Baseline:  only doing PROM at this time Goal status: INITIAL  LONG TERM GOALS: Target date: 07/23/24  The patient will be indep with progression of HEP. Baseline:  initiated at eval Goal status: INITIAL  2.  The patient will improve Quick Dash by 15%  to demonstrate improved functional abilities. Baseline: see above Goal status: INITIAL  3.  The patient will have AROM R side to 150 degrees flexion, 150 degrees scaption, and 50 degrees ER. Baseline: see above Goal status: INITIAL  4.  The patient will be able to return to modified gym routine to continue strengthening program within parameters of protocol (will be around 10 weeks at goal date). Baseline:  not doing shoulder exercises Goal status: INITIAL  PLAN: PT  FREQUENCY: 1-2x/week  PT DURATION: 8 weeks  PLANNED INTERVENTIONS: 97164- PT Re-evaluation, 97110-Therapeutic exercises, 97530- Therapeutic activity, [id number]- Neuromuscular re-education, 97535- Self Care, [id number]- Manual therapy, [id number]- Electrical stimulation (unattended), [id number]- Electrical stimulation (manual), 97016- Vasopneumatic device, 20560 (1-2 muscles), 20561 (3+ muscles)- Dry Needling, Patient/Family education, Taping, Joint mobilization, Cryotherapy, and Moist heat  PLAN FOR NEXT SESSION: progress per protocol to patient tolerance.    Quintavius Niebuhr, PT 05/24/2024, 3:06 PM

## 2024-05-31 ENCOUNTER — Ambulatory Visit: Admitting: Rehabilitative and Restorative Service Providers"

## 2024-05-31 ENCOUNTER — Encounter: Payer: Self-pay | Admitting: Rehabilitative and Restorative Service Providers"

## 2024-05-31 DIAGNOSIS — M6281 Muscle weakness (generalized): Secondary | ICD-10-CM

## 2024-05-31 DIAGNOSIS — R293 Abnormal posture: Secondary | ICD-10-CM

## 2024-05-31 DIAGNOSIS — M25511 Pain in right shoulder: Secondary | ICD-10-CM

## 2024-05-31 NOTE — Therapy (Signed)
 OUTPATIENT PHYSICAL THERAPY UPPER EXTREMITY EVALUATION   Patient Name: Derek Brewer MRN: [id number] DOB:[birth year], 48 y.o., male Today's Date: 05/31/2024  END OF SESSION:  PT End of Session - 05/31/24 0936     Visit Number 2    Number of Visits 16    Date for Recertification  07/23/24    Authorization Type BCBS 2025 (CAL YEAR)  *NO AUTH REQUIRED PER [employer]  OOP MAX HAS BEEN MET  26 VISITS FOR THE YEAR ALLOWED WITHOUT PRIOR AUTH  PER [employer]  05/23/2024  TLL    Authorization Time Period check that this is calendar year?    Authorization - Visit Number 2    Authorization - Number of Visits 26    PT Start Time 0930    PT Stop Time 1015    PT Time Calculation (min) 45 min    Activity Tolerance Patient tolerated treatment well          Past Medical History:  Diagnosis Date   Cervical radiculopathy    Cyst of buttocks 01/2016   left buttock    Dental crowns present    x 2   Mixed hyperlipidemia 12/20/2018   Tobacco use    Past Surgical History:  Procedure Laterality Date   ACHILLES TENDON REPAIR Left    CYST EXCISION Left 01/24/2016   Procedure: EXCISION LEFT BUTTOCK CYST;  Surgeon: Vicenta Poli, MD;  Location: [facility];  Service: General;  Laterality: Left;   EPIDURAL BLOCK INJECTION  2020   cervical   KNEE ARTHROSCOPY WITH PATELLAR TENDON REPAIR Right    Patient Active Problem List   Diagnosis Date Noted   Rotator cuff tear, right 11/05/2023   Cyst of eyelid, right 12/25/2022   Degenerative disc disease, lumbar 02/19/2022   Perianal abscess 12/25/2020   Well adult exam 12/19/2019   Mixed hyperlipidemia 12/20/2018   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 Dozier, MD)  REFERRING DIAG: ICD-10 Z98.890 Other specified postprocedural states  THERAPY DIAG:  Muscle weakness (generalized)  Acute pain of right  shoulder  Abnormal posture  Rationale for Evaluation and Treatment: Rehabilitation  ONSET DATE: 05/10/24  SUBJECTIVE:                                                                                                                                                                                      SUBJECTIVE STATEMENT: Patient reports that he has been doing well with exercises. No pain - only mild discomfort. 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  Eval: The patient got significant pain in the R shoulder in May and learned he had a R supraspinatus tear. He also had atrophy in the shoulder and was found to have c-spine compression and underwent ACDF in July for his neck. He underwent R RTC on 05/10/24.   Hand dominance: Left  PERTINENT HISTORY: ACDF 01/12/24 cervical decompression surgery C3-C5, hyperlipidemia  PAIN:  Are you having pain? Yes: NPRS scale: soreness Pain location: R shoulder Pain description: soreness Aggravating factors: none Relieving factors: none  PRECAUTIONS: Shoulder-- follow protocol   WEIGHT BEARING RESTRICTIONS: Yes no loading per protocol  FALLS:  Has patient fallen in last 6 months? 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   PATIENT SURVEYS :  Quick Dash: =47.7%      SENSATION: WFL  POSTURE: Notable atrophy in R anterior and middle deltoid *has recent cervical decompression  UPPER EXTREMITY ROM:  *only within allowed ROM per protocol  Passive ROM Right eval Left eval  Shoulder flexion 90 degrees PROM WNLs    Shoulder extension    Shoulder abduction    Shoulder adduction    Shoulder internal rotation    Shoulder external rotation To 20 degrees PROM   Elbow flexion Full AROM   Elbow extension Full AROM   Wrist flexion    Wrist extension    Wrist ulnar deviation    Wrist radial deviation    Wrist pronation    Wrist supination    (Blank rows = not tested)  UPPER EXTREMITY MMT: MMT Right eval Left eval  Shoulder flexion    Shoulder extension    Shoulder abduction    Shoulder adduction    Shoulder internal rotation    Shoulder external rotation    Middle trapezius    Lower trapezius    Elbow flexion >3/5   Elbow extension >3/5   Wrist flexion >3/5   Wrist extension >3/5   Wrist ulnar deviation    Wrist radial deviation    Wrist pronation    Wrist supination    Grip strength (lbs)    (Blank rows = not tested)                                                                                                                          [facility] Adult PT Treatment:  DATE: 05/31/24 Therapeutic Exercise: Standing  Pendulum Sitting   Elbow flexion/extension; forearm supination/pronation; wrist circumduction  Manual Therapy: STM R shoulder girdle  Passive ROM patient supine shoulder flexion to ~ 90 deg; ER to 15-20 deg in scapular plane  Modalities: Vaso 34 deg; low pressure; 10 min  Self Care: Upright posture avoiding sitting in slumped positions Scap massage for score sites     [facility] Adult PT Treatment:                                                DATE: 05/24/24 Therapeutic Exercise: See HEP Self Care: Reviewed precautions using sling for sleep and when not doing exercises Reviewed ROM limitations and how PROM is allowed right now-- no active movement  PATIENT EDUCATION: Education details: HEP, plan of care, precautions, protocol progresison Person educated: Patient Education method: Explanation, Demonstration, and  Handouts Education comprehension: verbalized understanding, returned demonstration, and needs further education  HOME EXERCISE PROGRAM: Access Code: [email or web address] URL: [email or web address] Date: 05/24/2024 Prepared by: Tawni Ferrier  Exercises - Circular Shoulder Pendulum with Table Support  - 2 x daily - 7 x weekly - 1 sets - 10 reps - Standing Scapular Retraction  - 2 x daily - 7 x weekly - 1 sets - 10 reps - Standing Elbow Flexion Extension AROM  - 2 x daily - 7 x weekly - 1 sets - 10 reps - Standing Forearm Pronation and Supination AROM  - 2 x daily - 7 x weekly - 1 sets - 10 reps - Wrist AROM Flexion Extension  - 2 x daily - 7 x weekly - 1 sets - 10 reps - seated ball squeeze  - 2 x daily - 7 x weekly - 1 sets - 10 reps  ASSESSMENT:  CLINICAL IMPRESSION: Patient reports compliance with HEP and no problems. Reviewed HEP but did not progress exercises for home. Discussed position for supporting shoulder when in sitting without the sling. Added manual work to address muscular tightness through R shoulder girdle; PROM without parameters of protocol - shoulder flexion to 90 deg, ER in scapular plane to 15-20 deg no pain. Treatment concluded with vaso to prevent soreness or pain following treatment. Patient tolerated treatment well.   Eval: Patient is a 48 y.o. male who was seen today for physical therapy evaluation and treatment for s/p R rotator cuff repair. Of note, he also had atrophy around the R shoulder -- most visible today in anterior deltoid (C5-C6). He had cervical decompression surgery in July prior to shoulder surgery. He presents with no limitations from neck demonstrating  functional ROM with c-spine. Patient has been performing home AAROM to 90 deg flexion with R shoulder at instruction from referring provider. At today's evaluation, he presents with impairments of dec'd ROM, dec'd strength, atrophy of shoulder musculature R shoulder. PT to work to restore full ROM  and return to lifting activities and wellness/strengthening routine.   OBJECTIVE IMPAIRMENTS: decreased ROM, decreased strength, hypomobility, increased fascial restrictions, impaired flexibility, postural dysfunction, and pain.    GOALS: Goals reviewed with patient? Yes  SHORT TERM GOALS: Target date: 06/22/24  The patient will be indep with initial HEP Baseline: initiated at eval Goal status: INITIAL  2.  The patient will improve PROM to 120 degrees, ER to 40 degrees  Baseline:  per protcol will be able to advance in 2 more  weeks (06/07/24) to more ROM--see above Goal status: INITIAL  3.  The patient will demonstrate AAROM without evidence of shoulder shrug. Baseline:  only doing PROM at this time Goal status: INITIAL  LONG TERM GOALS: Target date: 07/23/24  The patient will be indep with progression of HEP. Baseline:  initiated at eval Goal status: INITIAL  2.  The patient will improve Quick Dash by 15%  to demonstrate improved functional abilities. Baseline: see above Goal status: INITIAL  3.  The patient will have AROM R side to 150 degrees flexion, 150 degrees scaption, and 50 degrees ER. Baseline: see above Goal status: INITIAL  4.  The patient will be able to return to modified gym routine to continue strengthening program within parameters of protocol (will be around 10 weeks at goal date). Baseline:  not doing shoulder exercises Goal status: INITIAL  PLAN: PT FREQUENCY: 1-2x/week  PT DURATION: 8 weeks  PLANNED INTERVENTIONS: 97164- PT Re-evaluation, 97110-Therapeutic exercises, 97530- Therapeutic activity, [id number]- Neuromuscular re-education, 97535- Self Care, [id number]- Manual therapy, [id number]- Electrical stimulation (unattended), [id number]- Electrical stimulation (manual), 97016- Vasopneumatic device, 20560 (1-2 muscles), 20561 (3+ muscles)- Dry Needling, Patient/Family education, Taping, Joint mobilization, Cryotherapy, and Moist heat  PLAN FOR NEXT SESSION: progress  per protocol to patient tolerance.    [name] Eschbach P Jaidon Ellery, PT 05/31/2024, 9:38 AM

## 2024-06-06 ENCOUNTER — Ambulatory Visit: Admitting: Rehabilitative and Restorative Service Providers"

## 2024-06-06 ENCOUNTER — Encounter: Payer: Self-pay | Admitting: Rehabilitative and Restorative Service Providers"

## 2024-06-06 DIAGNOSIS — M25511 Pain in right shoulder: Secondary | ICD-10-CM

## 2024-06-06 DIAGNOSIS — M6281 Muscle weakness (generalized): Secondary | ICD-10-CM

## 2024-06-06 DIAGNOSIS — R293 Abnormal posture: Secondary | ICD-10-CM | POA: Diagnosis not present

## 2024-06-06 NOTE — Therapy (Signed)
 OUTPATIENT PHYSICAL THERAPY UPPER EXTREMITY TREATMENT   Patient Name: Derek Brewer MRN: [id number] DOB:1976-05-30, 49 y.o., male Today's Date: 06/06/2024  END OF SESSION:  PT End of Session - 06/06/24 0928     Visit Number 3    Number of Visits 16    Date for Recertification  07/23/24    Authorization Type BCBS 2025 ([place] YEAR)  *NO AUTH REQUIRED PER [employer]  OOP MAX HAS BEEN MET  26 VISITS FOR THE YEAR ALLOWED WITHOUT PRIOR AUTH  PER [employer]  05/23/2024  TLL    Authorization Time Period check that this is calendar year?    Authorization - Visit Number 3    Authorization - Number of Visits 26    PT Start Time [phone]    PT Stop Time 1015    PT Time Calculation (min) 47 min    Activity Tolerance Patient tolerated treatment well          Past Medical History:  Diagnosis Date   Cervical radiculopathy    Cyst of buttocks 01/2016   left buttock    Dental crowns present    x 2   Mixed hyperlipidemia 12/20/2018   Tobacco use    Past Surgical History:  Procedure Laterality Date   ACHILLES TENDON REPAIR Left    CYST EXCISION Left 01/24/2016   Procedure: EXCISION LEFT BUTTOCK CYST;  Surgeon: Vicenta Poli, MD;  Location: [facility];  Service: General;  Laterality: Left;   EPIDURAL BLOCK INJECTION  2020   cervical   KNEE ARTHROSCOPY WITH PATELLAR TENDON REPAIR Right    Patient Active Problem List   Diagnosis Date Noted   Rotator cuff tear, right 11/05/2023   Cyst of eyelid, right 12/25/2022   Degenerative disc disease, lumbar 02/19/2022   Perianal abscess 12/25/2020   Well adult exam 12/19/2019   Mixed hyperlipidemia 12/20/2018   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 Dozier, MD)  REFERRING DIAG: ICD-10 Z98.890 Other specified postprocedural states  THERAPY DIAG:  Muscle weakness (generalized)  Acute pain of right  shoulder  Abnormal posture  Rationale for Evaluation and Treatment: Rehabilitation  ONSET DATE: 05/10/24  SUBJECTIVE:                                                                                                                                                                                      SUBJECTIVE STATEMENT: Patient reports that he has been doing well with exercises. No pain - only mild discomfort and some stiffness. 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   Eval: The patient got significant pain in the R shoulder in May and learned he had a R supraspinatus tear. He also had atrophy in the shoulder and was found to have c-spine compression and underwent ACDF in July for his neck. He underwent R RTC on 05/10/24.   Hand dominance: Left  PERTINENT HISTORY: ACDF 01/12/24 cervical decompression surgery C3-C5, hyperlipidemia  PAIN:  Are you having pain? Yes: NPRS scale: soreness; tightness  Pain location: R shoulder Pain description: soreness Aggravating factors: none Relieving factors: none  PRECAUTIONS: Shoulder-- follow protocol   WEIGHT BEARING RESTRICTIONS: Yes no loading per protocol  FALLS:  Has patient fallen in last 6 months? 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   PATIENT SURVEYS :  Quick Dash: =47.7%      SENSATION: WFL  POSTURE: Notable atrophy in R anterior and middle deltoid *has recent cervical decompression  UPPER EXTREMITY ROM:  *only within allowed ROM per protocol  Passive ROM Right eval Left eval  Shoulder  flexion 90 degrees PROM WNLs   Shoulder extension    Shoulder abduction    Shoulder adduction    Shoulder internal rotation    Shoulder external rotation To 20 degrees PROM   Elbow flexion Full AROM   Elbow extension Full AROM   Wrist flexion    Wrist extension    Wrist ulnar deviation    Wrist radial deviation    Wrist pronation    Wrist supination    (Blank rows = not tested)  UPPER EXTREMITY MMT: MMT Right eval Left eval  Shoulder flexion    Shoulder extension    Shoulder abduction    Shoulder adduction    Shoulder internal rotation    Shoulder external rotation    Middle trapezius    Lower trapezius    Elbow flexion >3/5   Elbow extension >3/5   Wrist flexion >3/5   Wrist extension >3/5   Wrist ulnar deviation    Wrist radial deviation    Wrist pronation    Wrist supination    Grip strength (lbs)    (Blank rows = not tested)    [facility] Adult PT Treatment:                                                DATE: 06/06/24 Therapeutic Exercise: Standing  Pendulum Sitting   Elbow flexion/extension; forearm supination/pronation; wrist circumduction  Manual Therapy: STM R shoulder girdle  P/AA forearm supination - tight; AA elbow extension with wrist extension to stretch forearm  Passive ROM patient supine shoulder flexion to ~ 100 deg; ER to 20 deg in scapular plane  Neuromuscular re-eduction:   Scap squeeze 5 sec x 10 sitting and supine  Modalities: Vaso 34 deg; low pressure; 10 min  Self Care: Upright posture avoiding sitting in slumped positions Scap massage for score sites  [facility] Adult PT Treatment:                                                DATE: 05/31/24 Therapeutic Exercise: Standing  Pendulum Sitting   Elbow flexion/extension; forearm supination/pronation; wrist circumduction  Manual Therapy: STM R shoulder girdle  Passive ROM  patient supine shoulder flexion to ~ 90 deg; ER to 15-20 deg in scapular plane  Modalities: Vaso 34 deg; low pressure; 10 min  Self Care: Upright posture avoiding sitting in slumped positions Scap massage for score sites     [facility] Adult PT Treatment:                                                DATE: 05/24/24 Therapeutic Exercise: See HEP Self Care: Reviewed precautions using sling for sleep and when not doing exercises Reviewed ROM limitations and how PROM is allowed right now-- no active movement  PATIENT EDUCATION: Education details: HEP, plan of care, precautions, protocol progresison Person educated: Patient Education method: Explanation, Demonstration, and Handouts Education comprehension: verbalized understanding, returned demonstration, and needs further education  HOME EXERCISE PROGRAM: Access Code: [email or web address] URL: [email or web address] Date: 05/24/2024 Prepared by: Tawni Ferrier  Exercises - Circular Shoulder Pendulum with Table Support  - 2 x daily - 7 x weekly - 1 sets - 10 reps - Standing Scapular Retraction  - 2 x daily - 7 x weekly - 1 sets - 10 reps - Standing Elbow Flexion Extension AROM  - 2 x daily - 7 x weekly - 1 sets - 10 reps - Standing Forearm Pronation and Supination AROM  - 2 x daily - 7 x weekly - 1 sets - 10 reps - Wrist AROM Flexion Extension  - 2 x daily - 7 x weekly - 1 sets - 10 reps - seated ball squeeze  - 2 x daily - 7 x weekly - 1 sets - 10 reps  ASSESSMENT:  CLINICAL IMPRESSION: Patient reports that hs is continuing with HEP without difficulty. Added supine elbow extension and forearm supination UE resting on pillow to address tightness in elbow and forearm. Discussed position for supporting shoulder when in sitting without the sling. Continued manual work to address muscular tightness through R shoulder girdle; PROM without parameters of protocol - shoulder flexion to 100 deg, ER in scapular plane to 15-20 deg no pain; no  tissue resistance. Treatment concluded with vaso to prevent soreness or pain following treatment. Patient tolerated treatment well.   Eval: Patient is a 48 y.o. male who was seen today for physical therapy evaluation and treatment for s/p R rotator cuff repair. Of note, he also had atrophy around the R shoulder -- most visible today in anterior deltoid (C5-C6). He had cervical decompression surgery in July prior to shoulder surgery. He presents with no limitations from neck demonstrating  functional ROM with c-spine. Patient has been performing home AAROM to 90 deg flexion with R shoulder at instruction from referring provider. At today's evaluation, he presents with impairments of dec'd ROM, dec'd strength, atrophy of shoulder musculature R shoulder. PT to work to restore full ROM and return to lifting activities and wellness/strengthening routine.   OBJECTIVE IMPAIRMENTS: decreased ROM, decreased strength, hypomobility, increased fascial restrictions, impaired  flexibility, postural dysfunction, and pain.    GOALS: Goals reviewed with patient? Yes  SHORT TERM GOALS: Target date: 06/22/24  The patient will be indep with initial HEP Baseline: initiated at eval Goal status: INITIAL  2.  The patient will improve PROM to 120 degrees, ER to 40 degrees  Baseline:  per protcol will be able to advance in 2 more weeks (06/07/24) to more ROM--see above Goal status: INITIAL  3.  The patient will demonstrate AAROM without evidence of shoulder shrug. Baseline:  only doing PROM at this time Goal status: INITIAL  LONG TERM GOALS: Target date: 07/23/24  The patient will be indep with progression of HEP. Baseline:  initiated at eval Goal status: INITIAL  2.  The patient will improve Quick Dash by 15%  to demonstrate improved functional abilities. Baseline: see above Goal status: INITIAL  3.  The patient will have AROM R side to 150 degrees flexion, 150 degrees scaption, and 50 degrees ER. Baseline:  see above Goal status: INITIAL  4.  The patient will be able to return to modified gym routine to continue strengthening program within parameters of protocol (will be around 10 weeks at goal date). Baseline:  not doing shoulder exercises Goal status: INITIAL  PLAN: PT FREQUENCY: 1-2x/week  PT DURATION: 8 weeks  PLANNED INTERVENTIONS: 97164- PT Re-evaluation, 97110-Therapeutic exercises, 97530- Therapeutic activity, [id number]- Neuromuscular re-education, 97535- Self Care, 02859- Manual therapy, [id number]- Electrical stimulation (unattended), [id number]- Electrical stimulation (manual), 97016- Vasopneumatic device, 20560 (1-2 muscles), 20561 (3+ muscles)- Dry Needling, Patient/Family education, Taping, Joint mobilization, Cryotherapy, and Moist heat  PLAN FOR NEXT SESSION: progress per protocol to patient tolerance.    Christepher Melchior P Bayler Nehring, PT 06/06/2024, 9:29 AM

## 2024-06-08 ENCOUNTER — Ambulatory Visit: Admitting: Rehabilitative and Restorative Service Providers"

## 2024-06-13 ENCOUNTER — Ambulatory Visit: Attending: Family Medicine | Admitting: Rehabilitative and Restorative Service Providers"

## 2024-06-13 ENCOUNTER — Encounter: Payer: Self-pay | Admitting: Rehabilitative and Restorative Service Providers"

## 2024-06-13 DIAGNOSIS — M25511 Pain in right shoulder: Secondary | ICD-10-CM | POA: Insufficient documentation

## 2024-06-13 DIAGNOSIS — M6281 Muscle weakness (generalized): Secondary | ICD-10-CM | POA: Diagnosis not present

## 2024-06-13 DIAGNOSIS — R293 Abnormal posture: Secondary | ICD-10-CM | POA: Insufficient documentation

## 2024-06-13 NOTE — Therapy (Signed)
 OUTPATIENT PHYSICAL THERAPY UPPER EXTREMITY TREATMENT   Patient Name: ALONSO GAPINSKI MRN: [id number] DOB:April 09, 1976, 48 y.o., male Today's Date: 06/13/2024  END OF SESSION:  PT End of Session - 06/13/24 0938     Visit Number 4    Number of Visits 16    Date for Recertification  07/23/24    Authorization Type BCBS 2025 (CAL YEAR)  *NO AUTH REQUIRED PER [employer]  OOP MAX HAS BEEN MET  26 VISITS FOR THE YEAR ALLOWED WITHOUT PRIOR AUTH  PER BLUE[employer]E  05/23/2024  TLL    Authorization Time Period check that this is calendar year?    Authorization - Visit Number 4    Authorization - Number of Visits 26    PT Start Time [phone]    PT Stop Time 1010    PT Time Calculation (min) 34 min    Activity Tolerance Patient tolerated treatment well    Behavior During Therapy WFL for tasks assessed/performed         Past Medical History:  Diagnosis Date   Cervical radiculopathy    Cyst of buttocks 01/2016   left buttock    Dental crowns present    x 2   Mixed hyperlipidemia 12/20/2018   Tobacco use    Past Surgical History:  Procedure Laterality Date   ACHILLES TENDON REPAIR Left    CYST EXCISION Left 01/24/2016   Procedure: EXCISION LEFT BUTTOCK CYST;  Surgeon: Vicenta Poli, MD;  Location: [facility];  Service: General;  Laterality: Left;   EPIDURAL BLOCK INJECTION  2020   cervical   KNEE ARTHROSCOPY WITH PATELLAR TENDON REPAIR Right    Patient Active Problem List   Diagnosis Date Noted   Rotator cuff tear, right 11/05/2023   Cyst of eyelid, right 12/25/2022   Degenerative disc disease, lumbar 02/19/2022   Perianal abscess 12/25/2020   Well adult exam 12/19/2019   Mixed hyperlipidemia 12/20/2018   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 Dozier, MD) REFERRING DIAG: ICD-10 Z98.890 Other specified postprocedural states THERAPY DIAG:  Muscle weakness  (generalized)  Acute pain of right shoulder  Abnormal posture  Rationale for Evaluation and Treatment: Rehabilitation  ONSET DATE: 05/10/24  SUBJECTIVE:                                                                                                                                                                                      SUBJECTIVE STATEMENT: The patient is doing well-- he plans to remove sling beginning tomorrow.  He is at 5 weeks tomorrow.  Eval: The patient got significant pain in the R shoulder in May and learned he had a R supraspinatus tear. He also had atrophy in the shoulder and was found to have c-spine compression and underwent ACDF in July for his neck. He underwent R RTC on 05/10/24.  Hand dominance: Left  PERTINENT HISTORY: ACDF 01/12/24 cervical decompression surgery C3-C5, hyperlipidemia  PAIN:  Are you having pain? Yes: NPRS scale: soreness; tightness  Pain location: R shoulder Pain description: soreness Aggravating factors: none Relieving factors: none  PRECAUTIONS: Shoulder-- follow protocol  WEIGHT BEARING RESTRICTIONS: Yes no loading per protocol  FALLS:  Has patient fallen in last 6 months? 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  PATIENT SURVEYS :  Quick Dash: =47.7%     SENSATION: WFL  POSTURE: Notable atrophy in R anterior and middle deltoid *has recent cervical decompression  UPPER EXTREMITY ROM:  *only within allowed ROM per protocol  Passive ROM Right eval Left eval Right 06/13/24  Shoulder flexion 90 degrees  PROM WNLs  AAROM 140  Shoulder extension     Shoulder abduction     Shoulder adduction     Shoulder internal rotation     Shoulder external rotation To 20 degrees PROM  AAROM 35 deg  Elbow flexion Full AROM    Elbow extension Full AROM    Wrist flexion     Wrist extension     Wrist ulnar deviation     Wrist radial deviation     Wrist pronation     Wrist supination     (Blank rows = not tested)  UPPER EXTREMITY MMT: MMT Right eval Left eval  Shoulder flexion    Shoulder extension    Shoulder abduction    Shoulder adduction    Shoulder internal rotation    Shoulder external rotation    Middle trapezius    Lower trapezius    Elbow flexion >3/5   Elbow extension >3/5   Wrist flexion >3/5   Wrist extension >3/5   Wrist ulnar deviation    Wrist radial deviation    Wrist pronation    Wrist supination    Grip strength (lbs)    (Blank rows = not tested)   [facility] Adult PT Treatment:                                                DATE: 06/13/24 *BEGAN transitioning to Week 5-6 activities within patient tolerance*--he is at 5 weeks tomorrow Therapeutic Exercise: Supine AAROM with dowel doing bench press motion --provided for home  AAROM shoulder flexion AAROM shoulder ER  Manual Therapy: PROM R shoulder flexion and ER  Therapeutic Activity: Prone scapular squeeze x 10 reps Prone row to neutral arm position x 10 reps Standing scapular squeeze Discussed use of sling as protocol allows removal within tolerance over week 4-6-- he plans to remove tomorrow; discussed protection at night or wearing sling if he develops soreness Discussed protocol  and HEP--- will be able to add more AAROM and isometrics next week.   [facility] Adult PT Treatment:                                                DATE: 06/06/24 Therapeutic Exercise: Standing  Pendulum Sitting   Elbow flexion/extension; forearm supination/pronation; wrist circumduction  Manual Therapy: STM R shoulder girdle  P/AA  forearm supination - tight; AA elbow extension with wrist extension to stretch forearm  Passive ROM patient supine shoulder flexion to ~ 100 deg; ER to 20 deg in scapular plane  Neuromuscular re-eduction:   Scap squeeze 5 sec x 10 sitting and supine  Modalities: Vaso 34 deg; low pressure; 10 min  Self Care: Upright posture avoiding sitting in slumped positions Scap massage for score sites                                                                                                                      PATIENT EDUCATION: Education details: HEP, plan of care, precautions, protocol progresison Person educated: Patient Education method: Explanation, Demonstration, and Handouts Education comprehension: verbalized understanding, returned demonstration, and needs further education  HOME EXERCISE PROGRAM: Access Code: [email or web address] URL: [email or web address] Date: 06/13/2024 Prepared by: Tawni Ferrier  Exercises - Circular Shoulder Pendulum with Table Support  - 2 x daily - 7 x weekly - 1 sets - 10 reps - Standing Scapular Retraction  - 2 x daily - 7 x weekly - 1 sets - 10 reps - Standing Elbow Flexion Extension AROM  - 2 x daily - 7 x weekly - 1 sets - 10 reps - Standing Forearm Pronation and Supination AROM  - 2 x daily - 7 x weekly - 1 sets - 10 reps - Wrist AROM Flexion Extension  - 2 x daily - 7 x weekly - 1 sets - 10 reps - seated ball squeeze  - 2 x daily - 7 x weekly - 1 sets - 10 reps - Prone Scapular Retraction  - 1 x daily - 7 x weekly - 2 sets - 10 reps - Supine Shoulder Press AAROM in Abduction with Dowel  - 1 x daily - 7 x weekly - 2 sets - 10 reps  ASSESSMENT:  CLINICAL IMPRESSION: The patient has good AAROM and PROM today. PT began with prone rowing today for Week 5-6 of protocol and patient plans to discontinue his sling use this week. PT to see next week at week 6 to initiate further development of HEP with more AAROM and AROM to tolerance. He is managing  his HEP without difficulties.  Eval: Patient is a 48 y.o. male who was seen today for physical therapy evaluation and treatment for s/p R rotator cuff repair. Of note, he also had atrophy around the R shoulder -- most visible today in anterior deltoid (C5-C6). He had cervical decompression  surgery in July prior to shoulder surgery. He presents with no limitations from neck demonstrating  functional ROM with c-spine. Patient has been performing home AAROM to 90 deg flexion with R shoulder at instruction from referring provider. At today's evaluation, he presents with impairments of dec'd ROM, dec'd strength, atrophy of shoulder musculature R shoulder. PT to work to restore full ROM and return to lifting activities and wellness/strengthening routine.   OBJECTIVE IMPAIRMENTS: decreased ROM, decreased strength, hypomobility, increased fascial restrictions, impaired flexibility, postural dysfunction, and pain.    GOALS: Goals reviewed with patient? Yes  SHORT TERM GOALS: Target date: 06/22/24  The patient will be indep with initial HEP Baseline: initiated at eval Goal status: MET  2.  The patient will improve PROM to 120 degrees, ER to 40 degrees  Baseline:  per protcol will be able to advance in 2 more weeks (06/07/24) to more ROM--see above Goal status: PROGRESSING  3.  The patient will demonstrate AAROM without evidence of shoulder shrug. Baseline:  only doing PROM at this time Goal status: INITIAL  LONG TERM GOALS: Target date: 07/23/24  The patient will be indep with progression of HEP. Baseline:  initiated at eval Goal status: INITIAL  2.  The patient will improve Quick Dash by 15%  to demonstrate improved functional abilities. Baseline: see above Goal status: INITIAL  3.  The patient will have AROM R side to 150 degrees flexion, 150 degrees scaption, and 50 degrees ER. Baseline: see above Goal status: INITIAL  4.  The patient will be able to return to modified gym routine to  continue strengthening program within parameters of protocol (will be around 10 weeks at goal date). Baseline:  not doing shoulder exercises Goal status: INITIAL  PLAN: PT FREQUENCY: 1-2x/week  PT DURATION: 8 weeks  PLANNED INTERVENTIONS: 97164- PT Re-evaluation, 97110-Therapeutic exercises, 97530- Therapeutic activity, [id number]- Neuromuscular re-education, 97535- Self Care, 02859- Manual therapy, [id number]- Electrical stimulation (unattended), [id number]- Electrical stimulation (manual), 97016- Vasopneumatic device, 20560 (1-2 muscles), 20561 (3+ muscles)- Dry Needling, Patient/Family education, Taping, Joint mobilization, Cryotherapy, and Moist heat  PLAN FOR NEXT SESSION: progress per protocol to patient tolerance.  INITIATE AAROM per protocol, progress to AROM and can begin rotator cuff isometrics.    Arne Schlender, PT 06/13/2024, 2:08 PM

## 2024-06-15 ENCOUNTER — Ambulatory Visit: Admitting: Rehabilitative and Restorative Service Providers"

## 2024-06-17 NOTE — Therapy (Unsigned)
 OUTPATIENT PHYSICAL THERAPY UPPER EXTREMITY TREATMENT   Patient Name: Derek Brewer MRN: [id number] DOB:[birth year], 48 y.o., male Today's Date: 06/21/2024  END OF SESSION:  PT End of Session - 06/21/24 1622     Visit Number 5    Number of Visits 16    Date for Recertification  07/23/24    Authorization Type BCBS 2025 ([place] YEAR)  *NO AUTH REQUIRED PER [employer]  OOP MAX HAS BEEN MET  26 VISITS FOR THE YEAR ALLOWED WITHOUT PRIOR AUTH  PER BLUE-E  05/23/2024  TLL    Authorization Time Period check that this is calendar year?    Authorization - Visit Number 5    Authorization - Number of Visits 26    PT Start Time 1622   pt arrived late   PT Stop Time 1700    PT Time Calculation (min) 38 min    Activity Tolerance Patient tolerated treatment well    Behavior During Therapy WFL for tasks assessed/performed          Past Medical History:  Diagnosis Date   Cervical radiculopathy    Cyst of buttocks 01/2016   left buttock    Dental crowns present    x 2   Mixed hyperlipidemia 12/20/2018   Tobacco use    Past Surgical History:  Procedure Laterality Date   ACHILLES TENDON REPAIR Left    CYST EXCISION Left 01/24/2016   Procedure: EXCISION LEFT BUTTOCK CYST;  Surgeon: Vicenta Poli, MD;  Location: [facility];  Service: General;  Laterality: Left;   EPIDURAL BLOCK INJECTION  2020   cervical   KNEE ARTHROSCOPY WITH PATELLAR TENDON REPAIR Right    Patient Active Problem List   Diagnosis Date Noted   Rotator cuff tear, right 11/05/2023   Cyst of eyelid, right 12/25/2022   Degenerative disc disease, lumbar 02/19/2022   Perianal abscess 12/25/2020   Well adult exam 12/19/2019   Mixed hyperlipidemia 12/20/2018   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 Dozier, MD) REFERRING DIAG: ICD-10 Z98.890 Other specified postprocedural states THERAPY DIAG:   Acute pain of right shoulder  Rationale for Evaluation and Treatment: Rehabilitation  ONSET DATE: 05/10/24  SUBJECTIVE:                                                                                                                                                                                      SUBJECTIVE STATEMENT: Pt reports minor right shoulder aches. Pt was wearing sling up until seeing doctor yesterday, stating he was approved to remove  sling. Presents today without wearing sling.   Eval: The patient got significant pain in the R shoulder in May and learned he had a R supraspinatus tear. He also had atrophy in the shoulder and was found to have c-spine compression and underwent ACDF in July for his neck. He underwent R RTC on 05/10/24.  Hand dominance: Left  PERTINENT HISTORY: ACDF 01/12/24 cervical decompression surgery C3-C5, hyperlipidemia  PAIN:  Are you having pain? Yes: NPRS scale: 3/10 soreness; tightness  Pain location: R shoulder Pain description: soreness Aggravating factors: none Relieving factors: none  PRECAUTIONS: Shoulder-- follow protocol  WEIGHT BEARING RESTRICTIONS: Yes no loading per protocol  FALLS:  Has patient fallen in last 6 months? 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  PATIENT SURVEYS :  Quick Dash: =47.7%     SENSATION: WFL  POSTURE: Notable atrophy in R anterior and middle deltoid *has recent cervical decompression  UPPER EXTREMITY ROM:  *only within allowed ROM per protocol  Passive ROM Right eval Left eval  Right 06/13/24 Right PROM 06/21/24  Shoulder flexion 90 degrees PROM WNLs  AAROM 140 154 PROM  Shoulder extension      Shoulder abduction      Shoulder adduction      Shoulder internal rotation      Shoulder external rotation To 20 degrees PROM  AAROM 35 deg   Elbow flexion Full AROM     Elbow extension Full AROM     Wrist flexion      Wrist extension      Wrist ulnar deviation      Wrist radial deviation      Wrist pronation      Wrist supination      (Blank rows = not tested)  UPPER EXTREMITY MMT: MMT Right eval Left eval  Shoulder flexion    Shoulder extension    Shoulder abduction    Shoulder adduction    Shoulder internal rotation    Shoulder external rotation    Middle trapezius    Lower trapezius    Elbow flexion >3/5   Elbow extension >3/5   Wrist flexion >3/5   Wrist extension >3/5   Wrist ulnar deviation    Wrist radial deviation    Wrist pronation    Wrist supination    Grip strength (lbs)    (Blank rows = not tested)  [facility] Adult PT Treatment:                                                DATE: 06/21/24 Therapeutic Exercise: HEP review Pulley's scaption x10, Abd x10, flexion x10  Standing AAROM shoulder ER x17, IR x10, abd x10 with cane Manual Therapy: PROM right shoulder flexion to 154 deg  Neuromuscular re-ed: Cervical retraction AROM 2x8-needed verbal cues for correct motion Shoulder retraction AROM 2x10 Self Care: Pt education on post surgical protocol, pt verbally states understanding  [facility] Adult PT Treatment:  DATE: 06/13/24 *BEGAN transitioning to Week 5-6 activities within patient tolerance*--he is at 5 weeks tomorrow Therapeutic Exercise: Supine AAROM with dowel doing bench press motion --provided for home  AAROM shoulder flexion AAROM shoulder ER  Manual Therapy: PROM R shoulder flexion and ER  Therapeutic Activity: Prone scapular squeeze x 10 reps Prone row to neutral arm position x 10  reps Standing scapular squeeze Discussed use of sling as protocol allows removal within tolerance over week 4-6-- he plans to remove tomorrow; discussed protection at night or wearing sling if he develops soreness Discussed protocol and HEP--- will be able to add more AAROM and isometrics next week.   [facility] Adult PT Treatment:                                                DATE: 06/06/24 Therapeutic Exercise: Standing  Pendulum Sitting   Elbow flexion/extension; forearm supination/pronation; wrist circumduction  Manual Therapy: STM R shoulder girdle  P/AA forearm supination - tight; AA elbow extension with wrist extension to stretch forearm  Passive ROM patient supine shoulder flexion to ~ 100 deg; ER to 20 deg in scapular plane  Neuromuscular re-eduction:   Scap squeeze 5 sec x 10 sitting and supine  Modalities: Vaso 34 deg; low pressure; 10 min  Self Care: Upright posture avoiding sitting in slumped positions Scap massage for score sites                                                                                                                      PATIENT EDUCATION: Education details: See treatment  Person educated: Patient Education method: Explanation, Demonstration, Tactile cues, and Verbal cues Education comprehension: verbalized understanding, returned demonstration, and needs further education  HOME EXERCISE PROGRAM: Access Code: [email or web address] URL: [email or web address] Date: 06/21/2024 Prepared by: Lavanda Cleverly  Exercises - Circular Shoulder Pendulum with Table Support  - 2 x daily - 7 x weekly - 1 sets - 10 reps - Standing Scapular Retraction  - 2 x daily - 7 x weekly - 1 sets - 10 reps - Standing Elbow Flexion Extension AROM  - 2 x daily - 7 x weekly - 1 sets - 10 reps - Standing Forearm Pronation and Supination AROM  - 2 x daily - 7 x weekly - 1 sets - 10 reps - Wrist AROM Flexion Extension  - 2 x daily - 7 x weekly - 1 sets - 10 reps - seated ball  squeeze  - 2 x daily - 7 x weekly - 1 sets - 10 reps - Prone Scapular Retraction  - 1 x daily - 7 x weekly - 2 sets - 10 reps - Supine Shoulder Press AAROM in Abduction with Dowel  - 1 x daily - 7 x weekly - 2 sets - 10 reps   ASSESSMENT:  CLINICAL IMPRESSION: Progressed AAROM with standing instead of  supine, using a cane. Pt tolerated well without adverse reactions. PROM shoulder flexion to 154 deg no pain. In addition, introduced pulley's to promote alternative mobility improvement. Required verbal and tactile cues for how far to move arm through ROM. Pt education on post surgical protocol, pt verbally stated understanding and compliance. Pt left without adverse reactions or additional pain. Will continue to progress as able.   Eval: Patient is a 48 y.o. male who was seen today for physical therapy evaluation and treatment for s/p R rotator cuff repair. Of note, he also had atrophy around the R shoulder -- most visible today in anterior deltoid (C5-C6). He had cervical decompression surgery in July prior to shoulder surgery. He presents with no limitations from neck demonstrating  functional ROM with c-spine. Patient has been performing home AAROM to 90 deg flexion with R shoulder at instruction from referring provider. At today's evaluation, he presents with impairments of dec'd ROM, dec'd strength, atrophy of shoulder musculature R shoulder. PT to work to restore full ROM and return to lifting activities and wellness/strengthening routine.   OBJECTIVE IMPAIRMENTS: decreased ROM, decreased strength, hypomobility, increased fascial restrictions, impaired flexibility, postural dysfunction, and pain.    GOALS: Goals reviewed with patient? Yes  SHORT TERM GOALS: Target date: 06/22/24  The patient will be indep with initial HEP Baseline: initiated at eval Goal status: MET  2.  The patient will improve PROM to 120 degrees, ER to 40 degrees  Baseline:  per protcol will be able to advance in 2  more weeks (06/07/24) to more ROM--see above Goal status: PROGRESSING  3.  The patient will demonstrate AAROM without evidence of shoulder shrug. Baseline:  only doing PROM at this time Goal status: MET on 06/21/24  LONG TERM GOALS: Target date: 07/23/24  The patient will be indep with progression of HEP. Baseline:  initiated at eval Goal status: INITIAL  2.  The patient will improve Quick Dash by 15%  to demonstrate improved functional abilities. Baseline: see above Goal status: INITIAL  3.  The patient will have AROM R side to 150 degrees flexion, 150 degrees scaption, and 50 degrees ER. Baseline: see above Goal status: INITIAL  4.  The patient will be able to return to modified gym routine to continue strengthening program within parameters of protocol (will be around 10 weeks at goal date). Baseline:  not doing shoulder exercises Goal status: INITIAL  PLAN: PT FREQUENCY: 1-2x/week  PT DURATION: 8 weeks  PLANNED INTERVENTIONS: 97164- PT Re-evaluation, 97110-Therapeutic exercises, 97530- Therapeutic activity, [id number]- Neuromuscular re-education, 97535- Self Care, 02859- Manual therapy, G0283- Electrical stimulation (unattended), [id number]- Electrical stimulation (manual), 97016- Vasopneumatic device, 20560 (1-2 muscles), 20561 (3+ muscles)- Dry Needling, Patient/Family education, Taping, Joint mobilization, Cryotherapy, and Moist heat  PLAN FOR NEXT SESSION: progress per protocol to patient tolerance.  INITIATE AAROM per protocol, progress to AROM and can begin rotator cuff isometrics. How did AAROM in standing go? Check ER PROM to mark off STG*   Lavanda Cleverly, Student-PT 06/21/2024, 5:19 PM

## 2024-06-21 ENCOUNTER — Ambulatory Visit

## 2024-06-21 DIAGNOSIS — R293 Abnormal posture: Secondary | ICD-10-CM | POA: Diagnosis not present

## 2024-06-21 DIAGNOSIS — M25511 Pain in right shoulder: Secondary | ICD-10-CM

## 2024-06-21 DIAGNOSIS — M6281 Muscle weakness (generalized): Secondary | ICD-10-CM | POA: Diagnosis not present

## 2024-06-23 ENCOUNTER — Encounter: Payer: Self-pay | Admitting: Rehabilitative and Restorative Service Providers"

## 2024-06-23 ENCOUNTER — Ambulatory Visit: Admitting: Rehabilitative and Restorative Service Providers"

## 2024-06-23 DIAGNOSIS — R293 Abnormal posture: Secondary | ICD-10-CM | POA: Diagnosis not present

## 2024-06-23 DIAGNOSIS — M25511 Pain in right shoulder: Secondary | ICD-10-CM | POA: Diagnosis not present

## 2024-06-23 DIAGNOSIS — M6281 Muscle weakness (generalized): Secondary | ICD-10-CM

## 2024-06-23 NOTE — Therapy (Signed)
 OUTPATIENT PHYSICAL THERAPY UPPER EXTREMITY TREATMENT   Patient Name: KERIC Brewer MRN: [id number] DOB:12/18/1975, 48 y.o., male Today's Date: 06/23/2024  END OF SESSION:  PT End of Session - 06/23/24 1532     Visit Number 6    Number of Visits 16    Date for Recertification  07/23/24    Authorization Type BCBS 2025 (CAL YEAR)  *NO AUTH REQUIRED PER [employer]  OOP MAX HAS BEEN MET  26 VISITS FOR THE YEAR ALLOWED WITHOUT PRIOR AUTH  PER [employer]  05/23/2024  TLL    Authorization Time Period check that this is calendar year?    Authorization - Visit Number 6    Authorization - Number of Visits 26    PT Start Time 1530    PT Stop Time 1625    PT Time Calculation (min) 55 min    Activity Tolerance Patient tolerated treatment well          Past Medical History:  Diagnosis Date   Cervical radiculopathy    Cyst of buttocks 01/2016   left buttock    Dental crowns present    x 2   Mixed hyperlipidemia 12/20/2018   Tobacco use    Past Surgical History:  Procedure Laterality Date   ACHILLES TENDON REPAIR Left    CYST EXCISION Left 01/24/2016   Procedure: EXCISION LEFT BUTTOCK CYST;  Surgeon: Vicenta Poli, MD;  Location: [facility];  Service: General;  Laterality: Left;   EPIDURAL BLOCK INJECTION  2020   cervical   KNEE ARTHROSCOPY WITH PATELLAR TENDON REPAIR Right    Patient Active Problem List   Diagnosis Date Noted   Rotator cuff tear, right 11/05/2023   Cyst of eyelid, right 12/25/2022   Degenerative disc disease, lumbar 02/19/2022   Perianal abscess 12/25/2020   Well adult exam 12/19/2019   Mixed hyperlipidemia 12/20/2018   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 Dozier, MD) REFERRING DIAG: ICD-10 Z98.890 Other specified postprocedural states THERAPY DIAG:  Acute pain of right shoulder  Muscle weakness (generalized)  Abnormal  posture  Rationale for Evaluation and Treatment: Rehabilitation  ONSET DATE: 05/10/24  SUBJECTIVE:                                                                                                                                                                                      SUBJECTIVE STATEMENT: Pt reports no pain in the R shoulder. He has weaned from the sling without difficulty. Has some stiffness, no pain.  Eval: The patient got significant pain  in the R shoulder in May and learned he had a R supraspinatus tear. He also had atrophy in the shoulder and was found to have c-spine compression and underwent ACDF in July for his neck. He underwent R RTC on 05/10/24.  Hand dominance: Left  PERTINENT HISTORY: ACDF 01/12/24 cervical decompression surgery C3-C5, hyperlipidemia  PAIN:  Are you having pain? Yes: NPRS scale: 1/10 soreness; tightness  Pain location: R shoulder Pain description: soreness Aggravating factors: none Relieving factors: none  PRECAUTIONS: Shoulder-- follow protocol  WEIGHT BEARING RESTRICTIONS: Yes no loading per protocol  FALLS:  Has patient fallen in last 6 months? 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  PATIENT SURVEYS :  Quick Dash: =47.7%     SENSATION: WFL  POSTURE: Notable atrophy in R anterior and middle deltoid *has recent cervical decompression  UPPER EXTREMITY ROM:  *only within allowed ROM per protocol  Passive ROM Right eval Left eval Right 06/13/24 Right PROM 06/21/24  Shoulder flexion 90 degrees PROM WNLs  AAROM  140 154 PROM  Shoulder extension      Shoulder abduction      Shoulder adduction      Shoulder internal rotation      Shoulder external rotation To 20 degrees PROM  AAROM 35 deg   Elbow flexion Full AROM     Elbow extension Full AROM     Wrist flexion      Wrist extension      Wrist ulnar deviation      Wrist radial deviation      Wrist pronation      Wrist supination      (Blank rows = not tested)  UPPER EXTREMITY MMT: MMT Right eval Left eval  Shoulder flexion    Shoulder extension    Shoulder abduction    Shoulder adduction    Shoulder internal rotation    Shoulder external rotation    Middle trapezius    Lower trapezius    Elbow flexion >3/5   Elbow extension >3/5   Wrist flexion >3/5   Wrist extension >3/5   Wrist ulnar deviation    Wrist radial deviation    Wrist pronation    Wrist supination    Grip strength (lbs)    (Blank rows = not tested)  [facility] Adult PT Treatment:                                                DATE: 06/23/24 Therapeutic Exercise: Pulley's scaption x10, flexion x10, horizontal abduction/adduction x 10   Standing AAROM shoulder ER x17, IR x10, abd x10 with cane Manual Therapy: PROM right shoulder flexion to 154 deg  Neuromuscular re-ed: Cervical retraction 5 sec x 10  Scapular retraction 5 sec x 10  Self Care: Pt education on post surgical protocol, pt verbally states understanding   [facility] Adult PT Treatment:  DATE: 06/21/24 Therapeutic Exercise: HEP review Pulley's scaption x10, Abd x10, flexion x10  Standing AAROM shoulder ER x17, IR x10, abd x10 with cane Manual Therapy: STM R shoulder girdle patient supine  PROM right shoulder; flexion to 154 deg; ER in scapular plane 43 deg   Neuromuscular re-ed: Cervical retraction AROM 2x8-needed verbal cues for correct motion Shoulder retraction AROM 2x10 Modalities: Vaso 34 deg; low pressure; 10 min  Self Care: Pt education on post surgical  protocol, pt verbally states understanding  [facility] Adult PT Treatment:                                                DATE: 06/13/24 *BEGAN transitioning to Week 5-6 activities within patient tolerance*--he is at 5 weeks tomorrow Therapeutic Exercise: Supine AAROM with dowel doing bench press motion --provided for home  AAROM shoulder flexion AAROM shoulder ER  Manual Therapy: PROM R shoulder flexion and ER  Therapeutic Activity: Prone scapular squeeze x 10 reps Prone row to neutral arm position x 10 reps Standing scapular squeeze Discussed use of sling as protocol allows removal within tolerance over week 4-6-- he plans to remove tomorrow; discussed protection at night or wearing sling if he develops soreness Discussed protocol and HEP--- will be able to add more AAROM and isometrics next week.   [facility] Adult PT Treatment:                                                DATE: 06/06/24 Therapeutic Exercise: Standing  Pendulum Sitting   Elbow flexion/extension; forearm supination/pronation; wrist circumduction  Manual Therapy: STM R shoulder girdle  P/AA forearm supination - tight; AA elbow extension with wrist extension to stretch forearm  Passive ROM patient supine shoulder flexion to ~ 100 deg; ER to 20 deg in scapular plane  Neuromuscular re-eduction:   Scap squeeze 5 sec x 10 sitting and supine  Modalities: Vaso 34 deg; low pressure; 10 min  Self Care: Upright posture avoiding sitting in slumped positions Scap massage for score sites                                                                                                                      PATIENT EDUCATION: Education details: See treatment  Person educated: Patient Education method: Explanation, Demonstration, Tactile cues, and Verbal cues Education comprehension: verbalized understanding, returned demonstration, and needs further education  HOME EXERCISE PROGRAM: Access Code: [email or web address] URL:  [email or web address] Date: 06/23/2024 Prepared by: Keshawna Dix  Exercises - Circular Shoulder Pendulum with Table Support  - 2 x daily - 7 x weekly - 1 sets - 10 reps - Standing Scapular Retraction  - 2 x daily - 7 x weekly - 1 sets - 10 reps -  Standing Elbow Flexion Extension AROM  - 2 x daily - 7 x weekly - 1 sets - 10 reps - Standing Forearm Pronation and Supination AROM  - 2 x daily - 7 x weekly - 1 sets - 10 reps - Wrist AROM Flexion Extension  - 2 x daily - 7 x weekly - 1 sets - 10 reps - seated ball squeeze  - 2 x daily - 7 x weekly - 1 sets - 10 reps - Prone Scapular Retraction  - 1 x daily - 7 x weekly - 2 sets - 10 reps - Supine Shoulder Press AAROM in Abduction with Dowel  - 1 x daily - 7 x weekly - 2 sets - 10 reps - Seated Shoulder External Rotation AAROM with Cane and Hand in Neutral  - 1 x daily - 7 x weekly - 3 sets - 10 reps - Seated Shoulder Flexion AAROM with Dowel  - 1 x daily - 7 x weekly - 3 sets - 10 reps - Seated Shoulder Abduction AAROM with Dowel  - 1 x daily - 7 x weekly - 3 sets - 10 reps - Scaption Wall Slide with Towel  - 1 x daily - 7 x weekly - 3 sets - 10 reps - Isometric Shoulder Abduction at Wall  - 2 x daily - 7 x weekly - 1 sets - 5-10 reps - 5 sec  hold - Isometric Shoulder External Rotation at Wall  - 1 x daily - 7 x weekly - 1 sets - 10 reps - 5 sec  hold - Standing Isometric Shoulder Internal Rotation at Doorway  - 2 x daily - 7 x weekly - 1 sets - 10 reps - 5 sec  hold - Standing Isometric Shoulder Extension with Doorway - Arm Bent  - 2 x daily - 7 x weekly - 1 sets - 5-10 reps - 5 sec  hold - Isometric Shoulder Flexion at Wall  - 2 x daily - 7 x weekly - 1 sets - 5-10 reps - 5 sec  hold   ASSESSMENT:  CLINICAL IMPRESSION: Patient is 6 weeks 2 days post R RCR. He has weaned from the sling without difficulty. Continued with AAROM with pulleys and standing using a cane. Patient demonstrates difficulty in achieving and maintaining  scapular retraction and depression without use of UE's moving into extension. Neuromuscular re-education working on posterior shoulder girdle activation and control. Patient activates pecs when attempting to lift chest. He also uses UE's when attempting scap squeeze/chest lift. Good improvement with neuromuscular re-ed. Continued with manual work and PROM R shoulder patient supine. Added isometric shoulder strengthening ain all planes without difficulty. Progress with shoulder rehab per protocol.   Eval: Patient is a 48 y.o. male who was seen today for physical therapy evaluation and treatment for s/p R rotator cuff repair. Of note, he also had atrophy around the R shoulder -- most visible today in anterior deltoid (C5-C6). He had cervical decompression surgery in July prior to shoulder surgery. He presents with no limitations from neck demonstrating  functional ROM with c-spine. Patient has been performing home AAROM to 90 deg flexion with R shoulder at instruction from referring provider. At today's evaluation, he presents with impairments of dec'd ROM, dec'd strength, atrophy of shoulder musculature R shoulder. PT to work to restore full ROM and return to lifting activities and wellness/strengthening routine.   OBJECTIVE IMPAIRMENTS: decreased ROM, decreased strength, hypomobility, increased fascial restrictions, impaired flexibility, postural dysfunction, and pain.  GOALS: Goals reviewed with patient? Yes  SHORT TERM GOALS: Target date: 06/22/24  The patient will be indep with initial HEP Baseline: initiated at eval Goal status: MET  2.  The patient will improve PROM to 120 degrees, ER to 40 degrees  Baseline:  per protcol will be able to advance in 2 more weeks (06/07/24) to more ROM--see above Goal status: MET 06/23/24  3.  The patient will demonstrate AAROM without evidence of shoulder shrug. Baseline:  only doing PROM at this time Goal status: MET on 06/21/24  LONG TERM GOALS: Target  date: 07/23/24  The patient will be indep with progression of HEP. Baseline:  initiated at eval Goal status: INITIAL  2.  The patient will improve Quick Dash by 15%  to demonstrate improved functional abilities. Baseline: see above Goal status: INITIAL  3.  The patient will have AROM R side to 150 degrees flexion, 150 degrees scaption, and 50 degrees ER. Baseline: see above Goal status: INITIAL  4.  The patient will be able to return to modified gym routine to continue strengthening program within parameters of protocol (will be around 10 weeks at goal date). Baseline:  not doing shoulder exercises Goal status: INITIAL  PLAN: PT FREQUENCY: 1-2x/week  PT DURATION: 8 weeks  PLANNED INTERVENTIONS: 97164- PT Re-evaluation, 97110-Therapeutic exercises, 97530- Therapeutic activity, [id number]- Neuromuscular re-education, 97535- Self Care, [id number]- Manual therapy, [id number]- Electrical stimulation (unattended), [id number]- Electrical stimulation (manual), 97016- Vasopneumatic device, 20560 (1-2 muscles), 20561 (3+ muscles)- Dry Needling, Patient/Family education, Taping, Joint mobilization, Cryotherapy, and Moist heat  PLAN FOR NEXT SESSION: progress per protocol to patient tolerance.  Progress AROM check response to isometrics; work on neuromuscular re-ed for posterior shoulder girdle progressing to disassociation of UE's while posterior shoulder girdle engaged    [employer], PT 06/23/2024, 4:27 PM

## 2024-06-28 ENCOUNTER — Ambulatory Visit: Admitting: Physical Therapy

## 2024-06-28 ENCOUNTER — Encounter: Payer: Self-pay | Admitting: Physical Therapy

## 2024-06-28 DIAGNOSIS — M6281 Muscle weakness (generalized): Secondary | ICD-10-CM

## 2024-06-28 DIAGNOSIS — R293 Abnormal posture: Secondary | ICD-10-CM | POA: Diagnosis not present

## 2024-06-28 DIAGNOSIS — M25511 Pain in right shoulder: Secondary | ICD-10-CM | POA: Diagnosis not present

## 2024-06-28 NOTE — Therapy (Signed)
 OUTPATIENT PHYSICAL THERAPY UPPER EXTREMITY TREATMENT   Patient Name: Derek Brewer MRN: [id number] DOB:[birth year], 48 y.o., male Today's Date: 06/28/2024  END OF SESSION:  PT End of Session - 06/28/24 1655     Visit Number 7    Number of Visits 16    Date for Recertification  07/23/24    Authorization Type BCBS 2025 ([place] YEAR)  *NO AUTH REQUIRED PER [employer]  OOP MAX HAS BEEN MET  26 VISITS FOR THE YEAR ALLOWED WITHOUT PRIOR AUTH  PER [employer]  05/23/2024  TLL    Authorization - Visit Number 7    Authorization - Number of Visits 26    PT Start Time 1615    PT Stop Time 1659    PT Time Calculation (min) 44 min    Activity Tolerance Patient tolerated treatment well    Behavior During Therapy WFL for tasks assessed/performed           Past Medical History:  Diagnosis Date   Cervical radiculopathy    Cyst of buttocks 01/2016   left buttock    Dental crowns present    x 2   Mixed hyperlipidemia 12/20/2018   Tobacco use    Past Surgical History:  Procedure Laterality Date   ACHILLES TENDON REPAIR Left    CYST EXCISION Left 01/24/2016   Procedure: EXCISION LEFT BUTTOCK CYST;  Surgeon: Vicenta Poli, MD;  Location: [facility];  Service: General;  Laterality: Left;   EPIDURAL BLOCK INJECTION  2020   cervical   KNEE ARTHROSCOPY WITH PATELLAR TENDON REPAIR Right    Patient Active Problem List   Diagnosis Date Noted   Rotator cuff tear, right 11/05/2023   Cyst of eyelid, right 12/25/2022   Degenerative disc disease, lumbar 02/19/2022   Perianal abscess 12/25/2020   Well adult exam 12/19/2019   Mixed hyperlipidemia 12/20/2018   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 Dozier, MD) REFERRING DIAG: ICD-10 Z98.890 Other specified postprocedural states THERAPY DIAG:  Acute pain of right shoulder  Muscle weakness (generalized)  Abnormal  posture  Rationale for Evaluation and Treatment: Rehabilitation  ONSET DATE: 05/10/24  SUBJECTIVE:                                                                                                                                                                                      SUBJECTIVE STATEMENT: Pt reports a little pain here and there but nothing too bad  Eval: The patient got significant pain in the R shoulder in May and learned he had  a R supraspinatus tear. He also had atrophy in the shoulder and was found to have c-spine compression and underwent ACDF in July for his neck. He underwent R RTC on 05/10/24.  Hand dominance: Left  PERTINENT HISTORY: ACDF 01/12/24 cervical decompression surgery C3-C5, hyperlipidemia  PAIN:  Are you having pain? Yes: NPRS scale: 1/10 soreness; tightness  Pain location: R shoulder Pain description: soreness Aggravating factors: none Relieving factors: none  PRECAUTIONS: Shoulder-- follow protocol  WEIGHT BEARING RESTRICTIONS: Yes no loading per protocol  FALLS:  Has patient fallen in last 6 months? 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  PATIENT SURVEYS :  Quick Dash: =47.7%     SENSATION: WFL  POSTURE: Notable atrophy in R anterior and middle deltoid *has recent cervical decompression  UPPER EXTREMITY ROM:  *only within allowed ROM per protocol  Passive ROM Right eval Left eval Right 06/13/24 Right PROM 06/21/24  Shoulder flexion 90 degrees PROM WNLs  AAROM 140 154 PROM  Shoulder extension      Shoulder  abduction      Shoulder adduction      Shoulder internal rotation      Shoulder external rotation To 20 degrees PROM  AAROM 35 deg   Elbow flexion Full AROM     Elbow extension Full AROM     Wrist flexion      Wrist extension      Wrist ulnar deviation      Wrist radial deviation      Wrist pronation      Wrist supination      (Blank rows = not tested)  UPPER EXTREMITY MMT: MMT Right eval Left eval  Shoulder flexion    Shoulder extension    Shoulder abduction    Shoulder adduction    Shoulder internal rotation    Shoulder external rotation    Middle trapezius    Lower trapezius    Elbow flexion >3/5   Elbow extension >3/5   Wrist flexion >3/5   Wrist extension >3/5   Wrist ulnar deviation    Wrist radial deviation    Wrist pronation    Wrist supination    Grip strength (lbs)    (Blank rows = not tested)  [facility] Adult PT Treatment:                                                DATE: 06/28/24 Therapeutic Exercise: Pulley flexion x 1:30, scaption x 1:30 Manual Therapy: PROM Rt shoulder to tolerance Neuromuscular re-ed: Isometrics at wall: ER 10 x 3 sec hold, IR 10 x 3 sec hold Bilat ER with scap squeeze x 15 Ball on wall CW/CCW 2  x30 sec each Therapeutic Activity: AROM scaption, abduction x 10 each Prone row to neutral 2 x 10  Self Care: Continued to educate pt on protocol and goals of care  [facility] Adult PT Treatment:  DATE: 06/23/24 Therapeutic Exercise: Pulley's scaption x10, flexion x10, horizontal abduction/adduction x 10   Standing AAROM shoulder ER x17, IR x10, abd x10 with cane Manual Therapy: PROM right shoulder flexion to 154 deg  Neuromuscular re-ed: Cervical retraction 5 sec x 10  Scapular retraction 5 sec x 10  Self Care: Pt education on post surgical protocol, pt verbally states understanding   [facility] Adult PT Treatment:                                                DATE: 06/21/24 Therapeutic  Exercise: HEP review Pulley's scaption x10, Abd x10, flexion x10  Standing AAROM shoulder ER x17, IR x10, abd x10 with cane Manual Therapy: STM R shoulder girdle patient supine  PROM right shoulder; flexion to 154 deg; ER in scapular plane 43 deg   Neuromuscular re-ed: Cervical retraction AROM 2x8-needed verbal cues for correct motion Shoulder retraction AROM 2x10 Modalities: Vaso 34 deg; low pressure; 10 min  Self Care: Pt education on post surgical protocol, pt verbally states understanding  [facility] Adult PT Treatment:                                                DATE: 06/13/24 *BEGAN transitioning to Week 5-6 activities within patient tolerance*--he is at 5 weeks tomorrow Therapeutic Exercise: Supine AAROM with dowel doing bench press motion --provided for home  AAROM shoulder flexion AAROM shoulder ER  Manual Therapy: PROM R shoulder flexion and ER  Therapeutic Activity: Prone scapular squeeze x 10 reps Prone row to neutral arm position x 10 reps Standing scapular squeeze Discussed use of sling as protocol allows removal within tolerance over week 4-6-- he plans to remove tomorrow; discussed protection at night or wearing sling if he develops soreness Discussed protocol and HEP--- will be able to add more AAROM and isometrics next week.                                                                                                                     PATIENT EDUCATION: Education details: See treatment  Person educated: Patient Education method: Explanation, Demonstration, Tactile cues, and Verbal cues Education comprehension: verbalized understanding, returned demonstration, and needs further education  HOME EXERCISE PROGRAM: Access Code: [email or web address] URL: [email or web address] Date: 06/23/2024 Prepared by: Celyn Holt  Exercises - Circular Shoulder Pendulum with Table Support  - 2 x daily - 7 x weekly - 1 sets - 10 reps - Standing Scapular Retraction  - 2 x  daily - 7 x weekly - 1 sets - 10 reps - Standing Elbow Flexion Extension AROM  - 2 x daily - 7 x weekly - 1 sets - 10 reps - Standing Forearm Pronation and Supination AROM  - 2  x daily - 7 x weekly - 1 sets - 10 reps - Wrist AROM Flexion Extension  - 2 x daily - 7 x weekly - 1 sets - 10 reps - seated ball squeeze  - 2 x daily - 7 x weekly - 1 sets - 10 reps - Prone Scapular Retraction  - 1 x daily - 7 x weekly - 2 sets - 10 reps - Supine Shoulder Press AAROM in Abduction with Dowel  - 1 x daily - 7 x weekly - 2 sets - 10 reps - Seated Shoulder External Rotation AAROM with Cane and Hand in Neutral  - 1 x daily - 7 x weekly - 3 sets - 10 reps - Seated Shoulder Flexion AAROM with Dowel  - 1 x daily - 7 x weekly - 3 sets - 10 reps - Seated Shoulder Abduction AAROM with Dowel  - 1 x daily - 7 x weekly - 3 sets - 10 reps - Scaption Wall Slide with Towel  - 1 x daily - 7 x weekly - 3 sets - 10 reps - Isometric Shoulder Abduction at Wall  - 2 x daily - 7 x weekly - 1 sets - 5-10 reps - 5 sec  hold - Isometric Shoulder External Rotation at Wall  - 1 x daily - 7 x weekly - 1 sets - 10 reps - 5 sec  hold - Standing Isometric Shoulder Internal Rotation at Doorway  - 2 x daily - 7 x weekly - 1 sets - 10 reps - 5 sec  hold - Standing Isometric Shoulder Extension with Doorway - Arm Bent  - 2 x daily - 7 x weekly - 1 sets - 5-10 reps - 5 sec  hold - Isometric Shoulder Flexion at Wall  - 2 x daily - 7 x weekly - 1 sets - 5-10 reps - 5 sec  hold   ASSESSMENT:  CLINICAL IMPRESSION: Pt with good tolerance to all exercises during session. He is improving tolerance to PROM and has almost achieved full flexion, still most limited in ER and abduction. Improving performance with AROM without compensations. Pt is progressing well towards goals  Eval: Patient is a 48 y.o. male who was seen today for physical therapy evaluation and treatment for s/p R rotator cuff repair. Of note, he also had atrophy around the R  shoulder -- most visible today in anterior deltoid (C5-C6). He had cervical decompression surgery in July prior to shoulder surgery. He presents with no limitations from neck demonstrating  functional ROM with c-spine. Patient has been performing home AAROM to 90 deg flexion with R shoulder at instruction from referring provider. At today's evaluation, he presents with impairments of dec'd ROM, dec'd strength, atrophy of shoulder musculature R shoulder. PT to work to restore full ROM and return to lifting activities and wellness/strengthening routine.   OBJECTIVE IMPAIRMENTS: decreased ROM, decreased strength, hypomobility, increased fascial restrictions, impaired flexibility, postural dysfunction, and pain.    GOALS: Goals reviewed with patient? Yes  SHORT TERM GOALS: Target date: 06/22/24  The patient will be indep with initial HEP Baseline: initiated at eval Goal status: MET  2.  The patient will improve PROM to 120 degrees, ER to 40 degrees  Baseline:  per protcol will be able to advance in 2 more weeks (06/07/24) to more ROM--see above Goal status: MET 06/23/24  3.  The patient will demonstrate AAROM without evidence of shoulder shrug. Baseline:  only doing PROM at this time Goal status: MET on  06/21/24  LONG TERM GOALS: Target date: 07/23/24  The patient will be indep with progression of HEP. Baseline:  initiated at eval Goal status: INITIAL  2.  The patient will improve Quick Dash by 15%  to demonstrate improved functional abilities. Baseline: see above Goal status: INITIAL  3.  The patient will have AROM R side to 150 degrees flexion, 150 degrees scaption, and 50 degrees ER. Baseline: see above Goal status: INITIAL  4.  The patient will be able to return to modified gym routine to continue strengthening program within parameters of protocol (will be around 10 weeks at goal date). Baseline:  not doing shoulder exercises Goal status: INITIAL  PLAN: PT FREQUENCY:  1-2x/week  PT DURATION: 8 weeks  PLANNED INTERVENTIONS: 97164- PT Re-evaluation, 97110-Therapeutic exercises, 97530- Therapeutic activity, [id number]- Neuromuscular re-education, 97535- Self Care, [id number]- Manual therapy, [id number]- Electrical stimulation (unattended), [id number]- Electrical stimulation (manual), 97016- Vasopneumatic device, 20560 (1-2 muscles), 20561 (3+ muscles)- Dry Needling, Patient/Family education, Taping, Joint mobilization, Cryotherapy, and Moist heat  PLAN FOR NEXT SESSION: progress per protocol to patient tolerance.  Progress AROM check response to isometrics; work on neuromuscular re-ed for posterior shoulder girdle progressing to disassociation of UE's while posterior shoulder girdle engaged    Marigold Mom, PT 06/28/2024, 5:00 PM

## 2024-06-30 ENCOUNTER — Ambulatory Visit: Admitting: Rehabilitative and Restorative Service Providers"

## 2024-06-30 ENCOUNTER — Encounter: Payer: Self-pay | Admitting: Rehabilitative and Restorative Service Providers"

## 2024-06-30 DIAGNOSIS — R293 Abnormal posture: Secondary | ICD-10-CM | POA: Diagnosis not present

## 2024-06-30 DIAGNOSIS — M25511 Pain in right shoulder: Secondary | ICD-10-CM | POA: Diagnosis not present

## 2024-06-30 DIAGNOSIS — M6281 Muscle weakness (generalized): Secondary | ICD-10-CM

## 2024-06-30 NOTE — Therapy (Signed)
 OUTPATIENT PHYSICAL THERAPY UPPER EXTREMITY TREATMENT   Patient Name: Derek Brewer MRN: [id number] DOB:11-08-1975, 48 y.o., male Today's Date: 06/30/2024  END OF SESSION:  PT End of Session - 06/30/24 1617     Visit Number 8    Number of Visits 16    Date for Recertification  07/23/24    Authorization Type BCBS 2025 ([place] YEAR)  *NO AUTH REQUIRED PER [employer]  OOP MAX HAS BEEN MET  26 VISITS FOR THE YEAR ALLOWED WITHOUT PRIOR AUTH  PER [employer]  05/23/2024  TLL    Authorization Time Period calendar year    Authorization - Visit Number 8    Authorization - Number of Visits 26    PT Start Time 1615    PT Stop Time 1700    PT Time Calculation (min) 45 min    Activity Tolerance Patient tolerated treatment well           Past Medical History:  Diagnosis Date   Cervical radiculopathy    Cyst of buttocks 01/2016   left buttock    Dental crowns present    x 2   Mixed hyperlipidemia 12/20/2018   Tobacco use    Past Surgical History:  Procedure Laterality Date   ACHILLES TENDON REPAIR Left    CYST EXCISION Left 01/24/2016   Procedure: EXCISION LEFT BUTTOCK CYST;  Surgeon: Vicenta Poli, MD;  Location: [facility];  Service: General;  Laterality: Left;   EPIDURAL BLOCK INJECTION  2020   cervical   KNEE ARTHROSCOPY WITH PATELLAR TENDON REPAIR Right    Patient Active Problem List   Diagnosis Date Noted   Rotator cuff tear, right 11/05/2023   Cyst of eyelid, right 12/25/2022   Degenerative disc disease, lumbar 02/19/2022   Perianal abscess 12/25/2020   Well adult exam 12/19/2019   Mixed hyperlipidemia 12/20/2018   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 Dozier, MD) REFERRING DIAG: ICD-10 Z98.890 Other specified postprocedural states THERAPY DIAG:  Acute pain of right shoulder  Muscle weakness (generalized)  Abnormal  posture  Rationale for Evaluation and Treatment: Rehabilitation  ONSET DATE: 05/10/24  SUBJECTIVE:                                                                                                                                                                                      SUBJECTIVE STATEMENT: Pt reports a little pain here and there but nothing too bad  Eval: The patient got significant pain in the R shoulder in May and learned he had a R  supraspinatus tear. He also had atrophy in the shoulder and was found to have c-spine compression and underwent ACDF in July for his neck. He underwent R RTC on 05/10/24.  Hand dominance: Left  PERTINENT HISTORY: ACDF 01/12/24 cervical decompression surgery C3-C5, hyperlipidemia  PAIN:  Are you having pain? Yes: NPRS scale: 1/10 soreness; tightness  Pain location: R shoulder Pain description: soreness Aggravating factors: none Relieving factors: none  PRECAUTIONS: Shoulder-- follow protocol  WEIGHT BEARING RESTRICTIONS: Yes no loading per protocol  FALLS:  Has patient fallen in last 6 months? 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  PATIENT SURVEYS :  Quick Dash: =47.7%     SENSATION: WFL  POSTURE: Notable atrophy in R anterior and middle deltoid *has recent cervical decompression  UPPER EXTREMITY ROM:  *only within allowed ROM per protocol  Passive ROM Right eval Left eval Right 06/13/24 Right PROM 06/21/24  Shoulder flexion 90 degrees PROM WNLs  AAROM 140 154 PROM  Shoulder extension      Shoulder  abduction      Shoulder adduction      Shoulder internal rotation      Shoulder external rotation To 20 degrees PROM  AAROM 35 deg   Elbow flexion Full AROM     Elbow extension Full AROM     Wrist flexion      Wrist extension      Wrist ulnar deviation      Wrist radial deviation      Wrist pronation      Wrist supination      (Blank rows = not tested)  UPPER EXTREMITY MMT: MMT Right eval Left eval  Shoulder flexion    Shoulder extension    Shoulder abduction    Shoulder adduction    Shoulder internal rotation    Shoulder external rotation    Middle trapezius    Lower trapezius    Elbow flexion >3/5   Elbow extension >3/5   Wrist flexion >3/5   Wrist extension >3/5   Wrist ulnar deviation    Wrist radial deviation    Wrist pronation    Wrist supination    Grip strength (lbs)    (Blank rows = not tested)  [facility] Adult PT Treatment:                                                DATE: 06/30/24 Therapeutic Exercise: Pulley flexion x 10 sec x 5, scaption x 10 sec x 5 Manual Therapy: STM R shoulder girdle  PROM Rt shoulder in all planes within tisue limits and patient tolerance Neuromuscular re-ed: Isometrics at wall: ER 10 x 3 sec hold, IR 10 x 3 sec hold, abduction 10 x 3 sec; extension 10 x 3  Bilat ER with scap squeeze x 10 Ball on wall CW/CCW 2 x 30 sec each Rhythmic stabilization supine shoulder 90 deg flexion small range flex/ext; horizontal ab/add; circles CW/CCW x 10-15 each  Therapeutic Activity: AROM bilat shoulder flexion using mirror to avoid shoulder girdle elevation x 10  AROM bilat shoulder scaption focus on scapular control x 10 AROM shoulder abduction x 10  Prone row to neutral 2 x 10  Self Care: Encouraged work on scapular control to improve shoulder control    [facility] Adult PT Treatment:                                                DATE: 06/28/24 Therapeutic Exercise: Pulley flexion x 1:30, scaption x 1:30 Manual Therapy: PROM Rt shoulder to  tolerance Neuromuscular re-ed: Isometrics at wall: ER 10 x 3 sec hold, IR 10 x 3 sec hold Bilat ER with scap squeeze x 15 Ball on wall CW/CCW 2  x30 sec each   Therapeutic Activity: AROM scaption, abduction x 10 each Prone row to neutral 2 x 10  Self Care: Continued to educate pt on protocol and goals of care  [facility] Adult PT Treatment:                                                DATE: 06/23/24 Therapeutic Exercise: Pulley's scaption x10, flexion x10, horizontal abduction/adduction x 10   Standing AAROM shoulder ER x17, IR x10, abd x10 with cane Manual Therapy: PROM right shoulder flexion to 154 deg  Neuromuscular re-ed: Cervical retraction 5 sec x 10  Scapular retraction 5 sec x 10  Self Care: Pt education on post surgical protocol, pt verbally states understanding   [facility] Adult PT Treatment:                                                DATE: 06/21/24 Therapeutic Exercise: HEP review Pulley's scaption x10, Abd x10, flexion x10  Standing AAROM shoulder ER x17, IR x10, abd x10 with cane Manual Therapy: STM R shoulder girdle patient supine  PROM right shoulder; flexion to 154 deg; ER in scapular plane 43 deg   Neuromuscular re-ed: Cervical retraction AROM 2x8-needed verbal cues for correct motion Shoulder retraction AROM 2x10 Modalities: Vaso 34 deg; low pressure; 10 min  Self Care: Pt education on post surgical protocol, pt verbally states understanding  [facility] Adult PT Treatment:                                                DATE: 06/13/24 *BEGAN transitioning to Week 5-6 activities within patient tolerance*--he is at 5 weeks tomorrow Therapeutic Exercise: Supine AAROM with dowel doing bench press motion --provided for home  AAROM shoulder flexion AAROM shoulder ER  Manual Therapy: PROM R shoulder flexion and ER  Therapeutic Activity: Prone scapular squeeze x 10 reps Prone row to neutral arm position x 10 reps Standing scapular squeeze Discussed use of sling as  protocol allows removal within tolerance over week 4-6-- he plans to remove tomorrow; discussed protection at night or wearing sling if he develops soreness Discussed protocol and HEP--- will be able to add more AAROM  and isometrics next week.                                                                                                                     PATIENT EDUCATION: Education details: See treatment  Person educated: Patient Education method: Explanation, Demonstration, Tactile cues, and Verbal cues Education comprehension: verbalized understanding, returned demonstration, and needs further education  HOME EXERCISE PROGRAM: Access Code: [email or web address] URL: [email or web address] Date: 06/30/2024 Prepared by: Makiah Foye  Exercises - Circular Shoulder Pendulum with Table Support  - 2 x daily - 7 x weekly - 1 sets - 10 reps - Standing Scapular Retraction  - 2 x daily - 7 x weekly - 1 sets - 10 reps - Standing Elbow Flexion Extension AROM  - 2 x daily - 7 x weekly - 1 sets - 10 reps - Standing Forearm Pronation and Supination AROM  - 2 x daily - 7 x weekly - 1 sets - 10 reps - Wrist AROM Flexion Extension  - 2 x daily - 7 x weekly - 1 sets - 10 reps - seated ball squeeze  - 2 x daily - 7 x weekly - 1 sets - 10 reps - Prone Scapular Retraction  - 1 x daily - 7 x weekly - 2 sets - 10 reps - Supine Shoulder Press AAROM in Abduction with Dowel  - 1 x daily - 7 x weekly - 2 sets - 10 reps - Seated Shoulder External Rotation AAROM with Cane and Hand in Neutral  - 1 x daily - 7 x weekly - 3 sets - 10 reps - Seated Shoulder Flexion AAROM with Dowel  - 1 x daily - 7 x weekly - 3 sets - 10 reps - Seated Shoulder Abduction AAROM with Dowel  - 1 x daily - 7 x weekly - 3 sets - 10 reps - Scaption Wall Slide with Towel  - 1 x daily - 7 x weekly - 3 sets - 10 reps - Isometric Shoulder Abduction at Wall  - 2 x daily - 7 x weekly - 1 sets - 5-10 reps - 5 sec  hold - Isometric Shoulder  External Rotation at Wall  - 1 x daily - 7 x weekly - 1 sets - 10 reps - 5 sec  hold - Standing Isometric Shoulder Internal Rotation at Doorway  - 2 x daily - 7 x weekly - 1 sets - 10 reps - 5 sec  hold - Standing Isometric Shoulder Extension with Doorway - Arm Bent  - 2 x daily - 7 x weekly - 1 sets - 5-10 reps - 5 sec  hold - Isometric Shoulder Flexion at Wall  - 2 x daily - 7 x weekly - 1 sets - 5-10 reps - 5 sec  hold - Supine Shoulder Rhythmic Stabilization- Horizontal Abduction/Adduction  - 1 x daily - 7 x weekly - 1 sets - 5-10 reps   ASSESSMENT:  CLINICAL IMPRESSION: Pt is 7 weeks 2 days post R RCR. He  has returned to work without difficulty. Wprking on exercises including AAROM and AROM R shoulder; periscapular work. Added rhythmic stabilization in supine. Manual work through R shoulder girdle. Noted palpable tightness in the anterior and middle deltoid/biceps area. Added myofacial ball release work in standing. Continued with PROM and end range stretching.   Eval: Patient is a 48 y.o. male who was seen today for physical therapy evaluation and treatment for s/p R rotator cuff repair. Of note, he also had atrophy around the R shoulder -- most visible today in anterior deltoid (C5-C6). He had cervical decompression surgery in July prior to shoulder surgery. He presents with no limitations from neck demonstrating  functional ROM with c-spine. Patient has been performing home AAROM to 90 deg flexion with R shoulder at instruction from referring provider. At today's evaluation, he presents with impairments of dec'd ROM, dec'd strength, atrophy of shoulder musculature R shoulder. PT to work to restore full ROM and return to lifting activities and wellness/strengthening routine.   OBJECTIVE IMPAIRMENTS: decreased ROM, decreased strength, hypomobility, increased fascial restrictions, impaired flexibility, postural dysfunction, and pain.    GOALS: Goals reviewed with patient? Yes  SHORT TERM  GOALS: Target date: 06/22/24  The patient will be indep with initial HEP Baseline: initiated at eval Goal status: MET  2.  The patient will improve PROM to 120 degrees, ER to 40 degrees  Baseline:  per protcol will be able to advance in 2 more weeks (06/07/24) to more ROM--see above Goal status: MET 06/23/24  3.  The patient will demonstrate AAROM without evidence of shoulder shrug. Baseline:  only doing PROM at this time Goal status: MET on 06/21/24  LONG TERM GOALS: Target date: 07/23/24  The patient will be indep with progression of HEP. Baseline:  initiated at eval Goal status: INITIAL  2.  The patient will improve Quick Dash by 15%  to demonstrate improved functional abilities. Baseline: see above Goal status: INITIAL  3.  The patient will have AROM R side to 150 degrees flexion, 150 degrees scaption, and 50 degrees ER. Baseline: see above Goal status: INITIAL  4.  The patient will be able to return to modified gym routine to continue strengthening program within parameters of protocol (will be around 10 weeks at goal date). Baseline:  not doing shoulder exercises Goal status: INITIAL  PLAN: PT FREQUENCY: 1-2x/week  PT DURATION: 8 weeks  PLANNED INTERVENTIONS: 97164- PT Re-evaluation, 97110-Therapeutic exercises, 97530- Therapeutic activity, [id number]- Neuromuscular re-education, 97535- Self Care, [id number]- Manual therapy, [id number]- Electrical stimulation (unattended), [id number]- Electrical stimulation (manual), 97016- Vasopneumatic device, 20560 (1-2 muscles), 20561 (3+ muscles)- Dry Needling, Patient/Family education, Taping, Joint mobilization, Cryotherapy, and Moist heat  PLAN FOR NEXT SESSION: progress per protocol to patient tolerance.  Progress AROM check response to isometrics; work on neuromuscular re-ed for posterior shoulder girdle progressing to disassociation of UE's while posterior shoulder girdle engaged    [employer], PT 06/30/2024, 4:18 PM

## 2024-07-05 ENCOUNTER — Ambulatory Visit: Admitting: Rehabilitative and Restorative Service Providers"

## 2024-07-05 DIAGNOSIS — R293 Abnormal posture: Secondary | ICD-10-CM

## 2024-07-05 DIAGNOSIS — M25511 Pain in right shoulder: Secondary | ICD-10-CM

## 2024-07-05 DIAGNOSIS — M6281 Muscle weakness (generalized): Secondary | ICD-10-CM

## 2024-07-05 NOTE — Therapy (Signed)
 " OUTPATIENT PHYSICAL THERAPY UPPER EXTREMITY TREATMENT   Patient Name: Derek Brewer MRN: [id number] DOB:[birth year], 48 y.o., male Today's Date: 07/05/2024  END OF SESSION:  PT End of Session - 07/05/24 1624     Visit Number 9    Number of Visits 16    Date for Recertification  07/23/24    Authorization Type BCBS 2025 ([place] YEAR)  *NO AUTH REQUIRED PER [employer]  OOP MAX HAS BEEN MET  26 VISITS FOR THE YEAR ALLOWED WITHOUT PRIOR AUTH  PER [employer]  05/23/2024  TLL    Authorization - Visit Number 9    Authorization - Number of Visits 26    PT Start Time 1615    PT Stop Time 1700    PT Time Calculation (min) 45 min    Activity Tolerance Patient tolerated treatment well           Past Medical History:  Diagnosis Date   Cervical radiculopathy    Cyst of buttocks 01/2016   left buttock    Dental crowns present    x 2   Mixed hyperlipidemia 12/20/2018   Tobacco use    Past Surgical History:  Procedure Laterality Date   ACHILLES TENDON REPAIR Left    CYST EXCISION Left 01/24/2016   Procedure: EXCISION LEFT BUTTOCK CYST;  Surgeon: Vicenta Poli, MD;  Location: [facility];  Service: General;  Laterality: Left;   EPIDURAL BLOCK INJECTION  2020   cervical   KNEE ARTHROSCOPY WITH PATELLAR TENDON REPAIR Right    Patient Active Problem List   Diagnosis Date Noted   Rotator cuff tear, right 11/05/2023   Cyst of eyelid, right 12/25/2022   Degenerative disc disease, lumbar 02/19/2022   Perianal abscess 12/25/2020   Well adult exam 12/19/2019   Mixed hyperlipidemia 12/20/2018   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 Dozier, MD) REFERRING DIAG: ICD-10 Z98.890 Other specified postprocedural states THERAPY DIAG:  Acute pain of right shoulder  Muscle weakness (generalized)  Abnormal posture  Rationale for Evaluation and Treatment:  Rehabilitation  ONSET DATE: 05/10/24  SUBJECTIVE:                                                                                                                                                                                      SUBJECTIVE STATEMENT: Pt reports no pain in the shoulder. He has been working control of shoulder blade without puling forward with pecs. Can tell he is improving. Work is well. No problems.  Eval: The patient got significant pain in  the R shoulder in May and learned he had a R supraspinatus tear. He also had atrophy in the shoulder and was found to have c-spine compression and underwent ACDF in July for his neck. He underwent R RTC on 05/10/24.  Hand dominance: Left  PERTINENT HISTORY: ACDF 01/12/24 cervical decompression surgery C3-C5, hyperlipidemia  PAIN:  Are you having pain? Yes: NPRS scale: 0/10 soreness; tightness  Pain location: R shoulder Pain description: soreness Aggravating factors: none Relieving factors: none  PRECAUTIONS: Shoulder-- follow protocol  WEIGHT BEARING RESTRICTIONS: Yes no loading per protocol  FALLS:  Has patient fallen in last 6 months? 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  PATIENT SURVEYS :  Quick Dash: =47.7%     SENSATION: WFL  POSTURE: Notable atrophy in R anterior and middle deltoid *has recent cervical decompression  UPPER EXTREMITY ROM:  *only within allowed ROM per protocol  Passive ROM Right eval Left eval Right 06/13/24 Right PROM 06/21/24  Shoulder flexion 90 degrees PROM WNLs   AAROM 140 154 PROM  Shoulder extension      Shoulder abduction      Shoulder adduction      Shoulder internal rotation      Shoulder external rotation To 20 degrees PROM  AAROM 35 deg   Elbow flexion Full AROM     Elbow extension Full AROM     Wrist flexion      Wrist extension      Wrist ulnar deviation      Wrist radial deviation      Wrist pronation      Wrist supination      (Blank rows = not tested)  UPPER EXTREMITY MMT: MMT Right eval Left eval  Shoulder flexion    Shoulder extension    Shoulder abduction    Shoulder adduction    Shoulder internal rotation    Shoulder external rotation    Middle trapezius    Lower trapezius    Elbow flexion >3/5   Elbow extension >3/5   Wrist flexion >3/5   Wrist extension >3/5   Wrist ulnar deviation    Wrist radial deviation    Wrist pronation    Wrist supination    Grip strength (lbs)    (Blank rows = not tested)  [facility] Adult PT Treatment:                                                DATE: 07/05/24 Therapeutic Exercise: Pulley flexion x 10 sec x 5, scaption x 10 sec x 5 Manual Therapy: STM R shoulder girdle  PROM Rt shoulder in all planes within tisue limits and patient tolerance Neuromuscular re-ed: Bilat ER with scap squeeze x 10 Ball on wall flexion/extension; horozontal ab/adduction; CW/CCW ~ 45-60 sec each Rhythmic stabilization supine shoulder 90 deg flexion small range flex/ext; horizontal ab/add; circles CW/CCW x 10-15 each  Therapeutic Activity: AROM bilat shoulder flexion avoiding shoulder girdle elevation x 10  AROM bilat shoulder scaption focus on scapular control x 10 AROM shoulder abduction x 10  Prone row to neutral 2 x 10 Isometric row green step back TB 3 sec x 10 x 2 Isometric ER green TB side step R 3 sec x 10 x 2 Isometric IR green TB side step L 3 sec x 10 x2 Isometric shoulder extension green TB step back 3 sec x 10 x 2  Self Care: Reviewed post op protocols for 8 weeks  Encouraged continued  work on scapular control to improve shoulder control   [facility] Adult PT Treatment:                                                DATE: 06/30/24 Therapeutic Exercise: Pulley flexion x 10 sec x 5, scaption x 10 sec x 5 Manual Therapy: STM R shoulder girdle  PROM Rt shoulder in all planes within tisue limits and patient tolerance Neuromuscular re-ed: Isometrics at wall: ER 10 x 3 sec hold, IR 10 x 3 sec hold, abduction 10 x 3 sec; extension 10 x 3  Bilat ER with scap squeeze x 10 Ball on wall CW/CCW 2 x 30 sec each Rhythmic stabilization supine shoulder 90 deg flexion small range flex/ext; horizontal ab/add; circles CW/CCW x 10-15 each  Therapeutic Activity: AROM bilat shoulder flexion using mirror to avoid shoulder girdle elevation x 10  AROM bilat shoulder scaption focus on scapular control x 10 AROM shoulder abduction x 10  Prone row to neutral 2 x 10   Self Care: Encouraged work on scapular control to improve shoulder control      [facility] Adult PT Treatment:                                                DATE: 06/28/24 Therapeutic Exercise: Pulley flexion x 1:30, scaption x 1:30 Manual Therapy: PROM Rt shoulder to tolerance Neuromuscular re-ed: Isometrics at wall: ER 10 x 3 sec hold, IR 10 x 3 sec hold Bilat ER with scap squeeze x 15 Ball on wall CW/CCW 2  x30 sec each   Therapeutic Activity: AROM scaption, abduction x 10 each Prone row to neutral 2 x 10  Self Care: Continued to educate pt on protocol and goals of care                                                                                                                   PATIENT EDUCATION: Education details: See treatment  Person educated: Patient Education method: Explanation, Demonstration, Tactile cues, and Verbal cues Education comprehension: verbalized understanding, returned demonstration, and needs further education  HOME EXERCISE PROGRAM: Access Code: [email or web address] URL:  [email or web address] Date: 07/05/2024 Prepared by: Justyn Langham  Exercises - Circular Shoulder Pendulum with  Table Support  - 2 x daily - 7 x weekly - 1 sets - 10 reps - Standing Scapular Retraction  - 2 x daily - 7 x weekly - 1 sets - 10 reps - Standing Elbow Flexion Extension AROM  - 2 x daily - 7 x weekly - 1 sets - 10 reps - Standing Forearm Pronation and Supination AROM  - 2 x daily - 7 x weekly - 1 sets - 10 reps - Wrist AROM Flexion Extension  - 2 x daily - 7 x weekly - 1 sets - 10 reps - seated ball squeeze  - 2 x daily - 7 x weekly - 1 sets - 10 reps - Prone Scapular Retraction  - 1 x daily - 7 x weekly - 2 sets - 10 reps - Supine Shoulder Press AAROM in Abduction with Dowel  - 1 x daily - 7 x weekly - 2 sets - 10 reps - Seated Shoulder External Rotation AAROM with Cane and Hand in Neutral  - 1 x daily - 7 x weekly - 3 sets - 10 reps - Seated Shoulder Flexion AAROM with Dowel  - 1 x daily - 7 x weekly - 3 sets - 10 reps - Seated Shoulder Abduction AAROM with Dowel  - 1 x daily - 7 x weekly - 3 sets - 10 reps - Scaption Wall Slide with Towel  - 1 x daily - 7 x weekly - 3 sets - 10 reps - Isometric Shoulder Abduction at Wall  - 2 x daily - 7 x weekly - 1 sets - 5-10 reps - 5 sec  hold - Isometric Shoulder External Rotation at Wall  - 1 x daily - 7 x weekly - 1 sets - 10 reps - 5 sec  hold - Standing Isometric Shoulder Internal Rotation at Doorway  - 2 x daily - 7 x weekly - 1 sets - 10 reps - 5 sec  hold - Standing Isometric Shoulder Extension with Doorway - Arm Bent  - 2 x daily - 7 x weekly - 1 sets - 5-10 reps - 5 sec  hold - Isometric Shoulder Flexion at Wall  - 2 x daily - 7 x weekly - 1 sets - 5-10 reps - 5 sec  hold - Supine Shoulder Rhythmic Stabilization- Horizontal Abduction/Adduction  - 1 x daily - 7 x weekly - 1 sets - 5-10 reps - Standing Shoulder Row Reactive Isometric  - 2 x daily - 7 x weekly - 1 sets - 10 reps - 30-45 sec  hold - Shoulder External Rotation  Reactive Isometrics  - 1 x daily - 7 x weekly - 1 sets - 10 reps - 3-5 sec  hold - Shoulder Internal Rotation Reactive Isometrics  - 2 x daily - 7 x weekly - 1-2 sets - 10 reps - 3-5 sec  hold - Shoulder Extension Reactive Isometrics with Elbow Extended  - 1 x daily - 7 x weekly - 1-2 sets - 10 reps - 3-5 sec  hold   ASSESSMENT:  CLINICAL IMPRESSION: Pt is 8 weeks post R RCR. He has no difficulty with return to work - mostly desk/computer. He has not returned to his PT job at [employer]. Working on exercises including AAROM and AROM R shoulder; periscapular work. Continued isometrics adding TB isometrics for home every other day. Will continue with wall isometrics on alternate days. Note good improvement in posterior shoulder girdle control with all exercises. Continue with myofacial ball release work for  home. Manual work through R shoulder girdle. Noted palpable tightness in the pecs; anterior and middle deltoid/biceps area. Continued with PROM and end range stretching.   Eval: Patient is a 48 y.o. male who was seen today for physical therapy evaluation and treatment for s/p R rotator cuff repair. Of note, he also had atrophy around the R shoulder -- most visible today in anterior deltoid (C5-C6). He had cervical decompression surgery in July prior to shoulder surgery. He presents with no limitations from neck demonstrating  functional ROM with c-spine. Patient has been performing home AAROM to 90 deg flexion with R shoulder at instruction from referring provider. At today's evaluation, he presents with impairments of dec'd ROM, dec'd strength, atrophy of shoulder musculature R shoulder. PT to work to restore full ROM and return to lifting activities and wellness/strengthening routine.   OBJECTIVE IMPAIRMENTS: decreased ROM, decreased strength, hypomobility, increased fascial restrictions, impaired flexibility, postural dysfunction, and pain.    GOALS: Goals reviewed with patient? Yes  SHORT TERM  GOALS: Target date: 06/22/24  The patient will be indep with initial HEP Baseline: initiated at eval Goal status: MET  2.  The patient will improve PROM to 120 degrees, ER to 40 degrees  Baseline:  per protcol will be able to advance in 2 more weeks (06/07/24) to more ROM--see above Goal status: MET 06/23/24  3.  The patient will demonstrate AAROM without evidence of shoulder shrug. Baseline:  only doing PROM at this time Goal status: MET on 06/21/24  LONG TERM GOALS: Target date: 07/23/24  The patient will be indep with progression of HEP. Baseline:  initiated at eval Goal status: INITIAL  2.  The patient will improve Quick Dash by 15%  to demonstrate improved functional abilities. Baseline: see above Goal status: INITIAL  3.  The patient will have AROM R side to 150 degrees flexion, 150 degrees scaption, and 50 degrees ER. Baseline: see above Goal status: INITIAL  4.  The patient will be able to return to modified gym routine to continue strengthening program within parameters of protocol (will be around 10 weeks at goal date). Baseline:  not doing shoulder exercises Goal status: INITIAL  PLAN: PT FREQUENCY: 1-2x/week  PT DURATION: 8 weeks  PLANNED INTERVENTIONS: 97164- PT Re-evaluation, 97110-Therapeutic exercises, 97530- Therapeutic activity, [id number]- Neuromuscular re-education, 97535- Self Care, [id number]- Manual therapy, [id number]- Electrical stimulation (unattended), [id number]- Electrical stimulation (manual), 97016- Vasopneumatic device, 20560 (1-2 muscles), 20561 (3+ muscles)- Dry Needling, Patient/Family education, Taping, Joint mobilization, Cryotherapy, and Moist heat  PLAN FOR NEXT SESSION: progress per protocol to patient tolerance.  Progress AROM check response to isometrics; work on neuromuscular re-ed for posterior shoulder girdle progressing to disassociation of UE's while posterior shoulder girdle engaged    [employer], PT 07/05/2024, 4:25 PM  "

## 2024-07-11 ENCOUNTER — Ambulatory Visit

## 2024-07-11 DIAGNOSIS — M6281 Muscle weakness (generalized): Secondary | ICD-10-CM | POA: Diagnosis not present

## 2024-07-11 DIAGNOSIS — R293 Abnormal posture: Secondary | ICD-10-CM | POA: Diagnosis not present

## 2024-07-11 DIAGNOSIS — M25511 Pain in right shoulder: Secondary | ICD-10-CM

## 2024-07-11 NOTE — Therapy (Signed)
 " OUTPATIENT PHYSICAL THERAPY UPPER EXTREMITY TREATMENT   Patient Name: Derek Brewer MRN: [id number] DOB:1976/03/27, 48 y.o., male Today's Date: 07/11/2024  END OF SESSION:  PT End of Session - 07/11/24 1535     Visit Number 10    Number of Visits 16    Date for Recertification  07/23/24    Authorization Type BCBS 2025 ([place] YEAR)  *NO AUTH REQUIRED PER [employer]  OOP MAX HAS BEEN MET  26 VISITS FOR THE YEAR ALLOWED WITHOUT PRIOR AUTH  PER BLUE[employer]E  05/23/2024  TLL    Authorization - Visit Number 10    Authorization - Number of Visits 26    PT Start Time 1535    PT Stop Time 1615    PT Time Calculation (min) 40 min    Activity Tolerance Patient tolerated treatment well            Past Medical History:  Diagnosis Date   Cervical radiculopathy    Cyst of buttocks 01/2016   left buttock    Dental crowns present    x 2   Mixed hyperlipidemia 12/20/2018   Tobacco use    Past Surgical History:  Procedure Laterality Date   ACHILLES TENDON REPAIR Left    CYST EXCISION Left 01/24/2016   Procedure: EXCISION LEFT BUTTOCK CYST;  Surgeon: Vicenta Poli, MD;  Location: [facility];  Service: General;  Laterality: Left;   EPIDURAL BLOCK INJECTION  2020   cervical   KNEE ARTHROSCOPY WITH PATELLAR TENDON REPAIR Right    Patient Active Problem List   Diagnosis Date Noted   Rotator cuff tear, right 11/05/2023   Cyst of eyelid, right 12/25/2022   Degenerative disc disease, lumbar 02/19/2022   Perianal abscess 12/25/2020   Well adult exam 12/19/2019   Mixed hyperlipidemia 12/20/2018   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 Dozier, MD) REFERRING DIAG: ICD-10 Z98.890 Other specified postprocedural states THERAPY DIAG:  Acute pain of right shoulder  Muscle weakness (generalized)  Abnormal posture  Rationale for Evaluation and Treatment:  Rehabilitation  ONSET DATE: 05/10/24  SUBJECTIVE:                                                                                                                                                                                      SUBJECTIVE STATEMENT: Patient reports the shoulder is feeling good today. Was a little sore yesterday, so didn't do too much of the exercises yesterday.   Eval: The patient got significant pain in the R shoulder in May and  learned he had a R supraspinatus tear. He also had atrophy in the shoulder and was found to have c-spine compression and underwent ACDF in July for his neck. He underwent R RTC on 05/10/24.  Hand dominance: Left  PERTINENT HISTORY: ACDF 01/12/24 cervical decompression surgery C3-C5, hyperlipidemia  PAIN:  Are you having pain? No  PRECAUTIONS: Shoulder-- follow protocol  WEIGHT BEARING RESTRICTIONS: Yes no loading per protocol  FALLS:  Has patient fallen in last 6 months? 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  PATIENT SURVEYS :  Quick Dash: =47.7%     SENSATION: WFL  POSTURE: Notable atrophy in R anterior and middle deltoid *has recent cervical decompression  UPPER EXTREMITY ROM:  *only within allowed ROM per protocol  Passive ROM Right eval Left eval Right 06/13/24 Right PROM 06/21/24  Shoulder flexion 90 degrees PROM WNLs  AAROM 140 154 PROM  Shoulder extension      Shoulder abduction      Shoulder adduction      Shoulder internal rotation      Shoulder external rotation To 20 degrees PROM  AAROM 35 deg    Elbow flexion Full AROM     Elbow extension Full AROM     Wrist flexion      Wrist extension      Wrist ulnar deviation      Wrist radial deviation      Wrist pronation      Wrist supination      (Blank rows = not tested)  UPPER EXTREMITY MMT: MMT Right eval Left eval  Shoulder flexion    Shoulder extension    Shoulder abduction    Shoulder adduction    Shoulder internal rotation    Shoulder external rotation    Middle trapezius    Lower trapezius    Elbow flexion >3/5   Elbow extension >3/5   Wrist flexion >3/5   Wrist extension >3/5   Wrist ulnar deviation    Wrist radial deviation    Wrist pronation    Wrist supination    Grip strength (lbs)    (Blank rows = not tested)  [facility] Adult PT Treatment:                                                DATE: 07/11/24 Therapeutic Exercise: HEP review/update   Neuromuscular re-ed: Sidelying ER 2 x 10  Sidelying shoulder abduction 2 x 10  Prone scapular retraction 2 x 10  Supine serratus punch 2 x 10  Seated bilateral ER 2 x 10  Therapeutic Activity: Pulleys flexion and scaption x 1 minute each  Inclined shoulder flexion AROM 2 x 10  Seated shoulder abduction short lever x 10, long lever x 10 (115 degrees) Finger ladder abduction x 5     [facility] Adult PT Treatment:  DATE: 07/05/24 Therapeutic Exercise: Pulley flexion x 10 sec x 5, scaption x 10 sec x 5 Manual Therapy: STM R shoulder girdle  PROM Rt shoulder in all planes within tisue limits and patient tolerance Neuromuscular re-ed: Bilat ER with scap squeeze x 10 Ball on wall flexion/extension; horozontal ab/adduction; CW/CCW ~ 45-60 sec each Rhythmic stabilization supine shoulder 90 deg flexion small range flex/ext; horizontal ab/add; circles CW/CCW x 10-15 each  Therapeutic Activity: AROM bilat shoulder flexion avoiding shoulder girdle elevation x 10  AROM bilat shoulder scaption focus on scapular control x 10 AROM  shoulder abduction x 10  Prone row to neutral 2 x 10 Isometric row green step back TB 3 sec x 10 x 2 Isometric ER green TB side step R 3 sec x 10 x 2 Isometric IR green TB side step L 3 sec x 10 x2 Isometric shoulder extension green TB step back 3 sec x 10 x 2  Self Care: Reviewed post op protocols for 8 weeks  Encouraged continued work on scapular control to improve shoulder control   [facility] Adult PT Treatment:                                                DATE: 06/30/24 Therapeutic Exercise: Pulley flexion x 10 sec x 5, scaption x 10 sec x 5 Manual Therapy: STM R shoulder girdle  PROM Rt shoulder in all planes within tisue limits and patient tolerance Neuromuscular re-ed: Isometrics at wall: ER 10 x 3 sec hold, IR 10 x 3 sec hold, abduction 10 x 3 sec; extension 10 x 3  Bilat ER with scap squeeze x 10 Ball on wall CW/CCW 2 x 30 sec each Rhythmic stabilization supine shoulder 90 deg flexion small range flex/ext; horizontal ab/add; circles CW/CCW x 10-15 each  Therapeutic Activity: AROM bilat shoulder flexion using mirror to avoid shoulder girdle elevation x 10  AROM bilat shoulder scaption focus on scapular control x 10 AROM shoulder abduction x 10  Prone row to neutral 2 x 10   Self Care: Encouraged work on scapular control to improve shoulder control                                                                                                                       PATIENT EDUCATION: Education details: HEP update  Person educated: Patient Education method: Programmer, Multimedia, Facilities Manager, Actor cues, and Verbal cues, handout  Education comprehension: verbalized understanding, returned demonstration, and needs further education  HOME EXERCISE PROGRAM: Access Code: [email or web address] URL: [email or web address] Date: 07/11/2024 Prepared by: Lucie Meeter  Exercises - Circular Shoulder Pendulum with Table Support  - 2 x daily - 7 x weekly - 1 sets - 10 reps - Standing  Scapular Retraction  - 2 x daily - 7 x weekly - 1 sets - 10 reps - Prone Scapular Retraction  - 1 x  daily - 7 x weekly - 2 sets - 10 reps - Supine Shoulder Press AAROM in Abduction with Dowel  - 1 x daily - 7 x weekly - 2 sets - 10 reps - Seated Shoulder External Rotation AAROM with Cane and Hand in Neutral  - 1 x daily - 7 x weekly - 3 sets - 10 reps - Seated Shoulder Flexion AAROM with Dowel  - 1 x daily - 7 x weekly - 3 sets - 10 reps - Seated Shoulder Abduction AAROM with Dowel  - 1 x daily - 7 x weekly - 3 sets - 10 reps - Scaption Wall Slide with Towel  - 1 x daily - 7 x weekly - 3 sets - 10 reps - Isometric Shoulder Abduction at Wall  - 2 x daily - 7 x weekly - 1 sets - 5-10 reps - 5 sec  hold - Isometric Shoulder Flexion at Wall  - 2 x daily - 7 x weekly - 1 sets - 5-10 reps - 5 sec  hold - Supine Shoulder Rhythmic Stabilization- Horizontal Abduction/Adduction  - 1 x daily - 7 x weekly - 1 sets - 5-10 reps - Standing Shoulder Row Reactive Isometric  - 1 x daily - 7 x weekly - 1 sets - 10 reps - 30-45 sec  hold - Shoulder External Rotation Reactive Isometrics  - 1 x daily - 7 x weekly - 1 sets - 10 reps - 3-5 sec  hold - Shoulder Internal Rotation Reactive Isometrics  - 1 x daily - 7 x weekly - 1-2 sets - 10 reps - 3-5 sec  hold - Shoulder Extension Reactive Isometrics with Elbow Extended  - 1 x daily - 7 x weekly - 1-2 sets - 10 reps - 3-5 sec  hold   ASSESSMENT:  CLINICAL IMPRESSION: Pt is nearly 9 weeks s/p Rt RCR tolerating post-op progression well. Continued with shoulder AROM per protocol with good tolerance. Fatigues with ER AROM, but no onset of pain. Able to achieve 115 degrees of standing shoulder abduction AROM without shoulder shrug present.   Eval: Patient is a 48 y.o. male who was seen today for physical therapy evaluation and treatment for s/p R rotator cuff repair. Of note, he also had atrophy around the R shoulder -- most visible today in anterior deltoid (C5-C6). He had  cervical decompression surgery in July prior to shoulder surgery. He presents with no limitations from neck demonstrating  functional ROM with c-spine. Patient has been performing home AAROM to 90 deg flexion with R shoulder at instruction from referring provider. At today's evaluation, he presents with impairments of dec'd ROM, dec'd strength, atrophy of shoulder musculature R shoulder. PT to work to restore full ROM and return to lifting activities and wellness/strengthening routine.   OBJECTIVE IMPAIRMENTS: decreased ROM, decreased strength, hypomobility, increased fascial restrictions, impaired flexibility, postural dysfunction, and pain.    GOALS: Goals reviewed with patient? Yes  SHORT TERM GOALS: Target date: 06/22/24  The patient will be indep with initial HEP Baseline: initiated at eval Goal status: MET  2.  The patient will improve PROM to 120 degrees, ER to 40 degrees  Baseline:  per protcol will be able to advance in 2 more weeks (06/07/24) to more ROM--see above Goal status: MET 06/23/24  3.  The patient will demonstrate AAROM without evidence of shoulder shrug. Baseline:  only doing PROM at this time Goal status: MET on 06/21/24  LONG TERM GOALS: Target date: 07/23/24  The patient  will be indep with progression of HEP. Baseline:  initiated at eval Goal status: INITIAL  2.  The patient will improve Quick Dash by 15%  to demonstrate improved functional abilities. Baseline: see above Goal status: INITIAL  3.  The patient will have AROM R side to 150 degrees flexion, 150 degrees scaption, and 50 degrees ER. Baseline: see above Goal status: INITIAL  4.  The patient will be able to return to modified gym routine to continue strengthening program within parameters of protocol (will be around 10 weeks at goal date). Baseline:  not doing shoulder exercises Goal status: INITIAL  PLAN: PT FREQUENCY: 1-2x/week  PT DURATION: 8 weeks  PLANNED INTERVENTIONS: 97164- PT  Re-evaluation, 97110-Therapeutic exercises, 97530- Therapeutic activity, [id number]- Neuromuscular re-education, 97535- Self Care, [id number]- Manual therapy, [id number]- Electrical stimulation (unattended), [id number]- Electrical stimulation (manual), 97016- Vasopneumatic device, 20560 (1-2 muscles), 20561 (3+ muscles)- Dry Needling, Patient/Family education, Taping, Joint mobilization, Cryotherapy, and Moist heat  PLAN FOR NEXT SESSION: progress per protocol to patient tolerance.  Progress AROM check response to isometrics; work on neuromuscular re-ed for posterior shoulder girdle progressing to disassociation of UE's while posterior shoulder girdle engaged    Lucie Meeter, PT, DPT, ATC 07/11/2024 4:19 PM  "

## 2024-07-13 ENCOUNTER — Encounter: Payer: Self-pay | Admitting: Physical Therapy

## 2024-07-13 ENCOUNTER — Ambulatory Visit: Admitting: Physical Therapy

## 2024-07-13 DIAGNOSIS — R293 Abnormal posture: Secondary | ICD-10-CM | POA: Diagnosis not present

## 2024-07-13 DIAGNOSIS — M6281 Muscle weakness (generalized): Secondary | ICD-10-CM

## 2024-07-13 DIAGNOSIS — M25511 Pain in right shoulder: Secondary | ICD-10-CM

## 2024-07-13 NOTE — Therapy (Signed)
 " OUTPATIENT PHYSICAL THERAPY UPPER EXTREMITY TREATMENT   Patient Name: Derek Brewer MRN: [id number] DOB:1976-02-11, 48 y.o., male Today's Date: 07/13/2024  END OF SESSION:  PT End of Session - 07/13/24 1614     Visit Number 11    Number of Visits 16    Date for Recertification  07/23/24    Authorization Type BCBS 2025 ([place] YEAR)  *NO AUTH REQUIRED PER [employer]  OOP MAX HAS BEEN MET  26 VISITS FOR THE YEAR ALLOWED WITHOUT PRIOR AUTH  PER BLUE[employer]E  05/23/2024  TLL    Authorization Time Period calendar year    Authorization - Visit Number 11    Authorization - Number of Visits 26    PT Start Time 1535    PT Stop Time 1613    PT Time Calculation (min) 38 min    Activity Tolerance Patient tolerated treatment well    Behavior During Therapy WFL for tasks assessed/performed             Past Medical History:  Diagnosis Date   Cervical radiculopathy    Cyst of buttocks 01/2016   left buttock    Dental crowns present    x 2   Mixed hyperlipidemia 12/20/2018   Tobacco use    Past Surgical History:  Procedure Laterality Date   ACHILLES TENDON REPAIR Left    CYST EXCISION Left 01/24/2016   Procedure: EXCISION LEFT BUTTOCK CYST;  Surgeon: Vicenta Poli, MD;  Location: [facility];  Service: General;  Laterality: Left;   EPIDURAL BLOCK INJECTION  2020   cervical   KNEE ARTHROSCOPY WITH PATELLAR TENDON REPAIR Right    Patient Active Problem List   Diagnosis Date Noted   Rotator cuff tear, right 11/05/2023   Cyst of eyelid, right 12/25/2022   Degenerative disc disease, lumbar 02/19/2022   Perianal abscess 12/25/2020   Well adult exam 12/19/2019   Mixed hyperlipidemia 12/20/2018   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 Dozier, MD) REFERRING DIAG: ICD-10 Z98.890 Other specified postprocedural states THERAPY DIAG:  Acute pain of right  shoulder  Muscle weakness (generalized)  Rationale for Evaluation and Treatment: Rehabilitation  ONSET DATE: 05/10/24  SUBJECTIVE:                                                                                                                                                                                      SUBJECTIVE STATEMENT: Patient reports  he feels good, no pain  Eval: The patient got significant pain in the R shoulder in May  and learned he had a R supraspinatus tear. He also had atrophy in the shoulder and was found to have c-spine compression and underwent ACDF in July for his neck. He underwent R RTC on 05/10/24.  Hand dominance: Left  PERTINENT HISTORY: ACDF 01/12/24 cervical decompression surgery C3-C5, hyperlipidemia  PAIN:  Are you having pain? No  PRECAUTIONS: Shoulder-- follow protocol  WEIGHT BEARING RESTRICTIONS: Yes no loading per protocol  FALLS:  Has patient fallen in last 6 months? 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  PATIENT SURVEYS :  Quick Dash: =47.7%     SENSATION: WFL  POSTURE: Notable atrophy in R anterior and middle deltoid *has recent cervical decompression  UPPER EXTREMITY ROM:  *only within allowed ROM per protocol  Passive ROM Right eval Left eval Right 06/13/24 Right PROM 06/21/24  Shoulder flexion 90 degrees PROM WNLs  AAROM 140 154 PROM  Shoulder extension      Shoulder abduction      Shoulder adduction      Shoulder internal rotation      Shoulder external rotation To 20 degrees PROM  AAROM 35 deg    Elbow flexion Full AROM     Elbow extension Full AROM     Wrist flexion      Wrist extension      Wrist ulnar deviation      Wrist radial deviation      Wrist pronation      Wrist supination      (Blank rows = not tested)  UPPER EXTREMITY MMT: MMT Right eval Left eval  Shoulder flexion    Shoulder extension    Shoulder abduction    Shoulder adduction    Shoulder internal rotation    Shoulder external rotation    Middle trapezius    Lower trapezius    Elbow flexion >3/5   Elbow extension >3/5   Wrist flexion >3/5   Wrist extension >3/5   Wrist ulnar deviation    Wrist radial deviation    Wrist pronation    Wrist supination    Grip strength (lbs)    (Blank rows = not tested)  [facility] Adult PT Treatment:                                                DATE: 07/13/24 Therapeutic Exercise: Pulley flexion x 1 min, scaption x 1 min  Neuromuscular re-ed: Ball on wall CW/CCW x 60 sec each Seated bilat ER 2 x 10 Sidelying ER 2 x 10  Sidelying shoulder abduction 2 x 10  Supine serratus punch 2 x 10 Supine diagonals 2 x 10 Rhythmic stabilization supine shoulder 90 deg flexion small range flex/ext; horizontal ab/add; circles CW/CCW x 10-15 each  Serratus slide red TB x 10 Therapeutic Activity: Standing with mirror to avoid compensations: Scaption x 10 Flexion x 10 Abduction x 10 Bent over row 2 x 10 with cues to not go past neutral Bent over  shoulder ext to netural 2 x 10    [facility] Adult PT Treatment:                                                DATE: 07/11/24 Therapeutic Exercise: HEP review/update   Neuromuscular re-ed: Sidelying ER 2 x 10  Sidelying shoulder abduction 2 x 10  Prone scapular retraction 2 x 10  Supine serratus punch 2 x 10  Seated bilateral ER 2 x 10  Therapeutic Activity: Pulleys flexion and scaption x 1 minute each  Inclined shoulder flexion AROM 2 x 10  Seated shoulder abduction short lever x 10, long lever x 10 (115 degrees) Finger ladder  abduction x 5     [facility] Adult PT Treatment:                                                DATE: 07/05/24 Therapeutic Exercise: Pulley flexion x 10 sec x 5, scaption x 10 sec x 5 Manual Therapy: STM R shoulder girdle  PROM Rt shoulder in all planes within tisue limits and patient tolerance Neuromuscular re-ed: Bilat ER with scap squeeze x 10 Ball on wall flexion/extension; horozontal ab/adduction; CW/CCW ~ 45-60 sec each Rhythmic stabilization supine shoulder 90 deg flexion small range flex/ext; horizontal ab/add; circles CW/CCW x 10-15 each  Therapeutic Activity: AROM bilat shoulder flexion avoiding shoulder girdle elevation x 10  AROM bilat shoulder scaption focus on scapular control x 10 AROM shoulder abduction x 10  Prone row to neutral 2 x 10 Isometric row green step back TB 3 sec x 10 x 2 Isometric ER green TB side step R 3 sec x 10 x 2 Isometric IR green TB side step L 3 sec x 10 x2 Isometric shoulder extension green TB step back 3 sec x 10 x 2  Self Care: Reviewed post op protocols for 8 weeks  Encouraged continued work on scapular control to improve shoulder control                                                                                                                    PATIENT EDUCATION: Education details: HEP update  Person educated: Patient Education method: Programmer, Multimedia, Facilities Manager, Actor cues, and Verbal cues, handout  Education comprehension: verbalized understanding, returned demonstration, and needs further education  HOME EXERCISE PROGRAM: Access Code: [email or web address] URL: [email or web address] Date: 07/11/2024 Prepared by: Lucie Meeter  Exercises - Circular Shoulder Pendulum with Table Support  - 2 x daily - 7 x weekly - 1 sets - 10 reps - Standing Scapular Retraction  - 2 x daily - 7 x weekly - 1 sets - 10 reps - Prone Scapular Retraction  - 1 x daily - 7 x weekly - 2 sets -  10 reps - Supine Shoulder Press AAROM in Abduction with  Dowel  - 1 x daily - 7 x weekly - 2 sets - 10 reps - Seated Shoulder External Rotation AAROM with Cane and Hand in Neutral  - 1 x daily - 7 x weekly - 3 sets - 10 reps - Seated Shoulder Flexion AAROM with Dowel  - 1 x daily - 7 x weekly - 3 sets - 10 reps - Seated Shoulder Abduction AAROM with Dowel  - 1 x daily - 7 x weekly - 3 sets - 10 reps - Scaption Wall Slide with Towel  - 1 x daily - 7 x weekly - 3 sets - 10 reps - Isometric Shoulder Abduction at Wall  - 2 x daily - 7 x weekly - 1 sets - 5-10 reps - 5 sec  hold - Isometric Shoulder Flexion at Wall  - 2 x daily - 7 x weekly - 1 sets - 5-10 reps - 5 sec  hold - Supine Shoulder Rhythmic Stabilization- Horizontal Abduction/Adduction  - 1 x daily - 7 x weekly - 1 sets - 5-10 reps - Standing Shoulder Row Reactive Isometric  - 1 x daily - 7 x weekly - 1 sets - 10 reps - 30-45 sec  hold - Shoulder External Rotation Reactive Isometrics  - 1 x daily - 7 x weekly - 1 sets - 10 reps - 3-5 sec  hold - Shoulder Internal Rotation Reactive Isometrics  - 1 x daily - 7 x weekly - 1-2 sets - 10 reps - 3-5 sec  hold - Shoulder Extension Reactive Isometrics with Elbow Extended  - 1 x daily - 7 x weekly - 1-2 sets - 10 reps - 3-5 sec  hold   ASSESSMENT:  CLINICAL IMPRESSION: Pt with good tolerance to all activities during session today. He has great ROM and has good mechanics with shoulder AROM. Progressing well per protocol  Eval: Patient is a 48 y.o. male who was seen today for physical therapy evaluation and treatment for s/p R rotator cuff repair. Of note, he also had atrophy around the R shoulder -- most visible today in anterior deltoid (C5-C6). He had cervical decompression surgery in July prior to shoulder surgery. He presents with no limitations from neck demonstrating  functional ROM with c-spine. Patient has been performing home AAROM to 90 deg flexion with R shoulder at instruction from referring provider. At today's evaluation, he presents with  impairments of dec'd ROM, dec'd strength, atrophy of shoulder musculature R shoulder. PT to work to restore full ROM and return to lifting activities and wellness/strengthening routine.   OBJECTIVE IMPAIRMENTS: decreased ROM, decreased strength, hypomobility, increased fascial restrictions, impaired flexibility, postural dysfunction, and pain.    GOALS: Goals reviewed with patient? Yes  SHORT TERM GOALS: Target date: 06/22/24  The patient will be indep with initial HEP Baseline: initiated at eval Goal status: MET  2.  The patient will improve PROM to 120 degrees, ER to 40 degrees  Baseline:  per protcol will be able to advance in 2 more weeks (06/07/24) to more ROM--see above Goal status: MET 06/23/24  3.  The patient will demonstrate AAROM without evidence of shoulder shrug. Baseline:  only doing PROM at this time Goal status: MET on 06/21/24  LONG TERM GOALS: Target date: 07/23/24  The patient will be indep with progression of HEP. Baseline:  initiated at eval Goal status: INITIAL  2.  The patient will improve Quick Dash by 15%  to  demonstrate improved functional abilities. Baseline: see above Goal status: INITIAL  3.  The patient will have AROM R side to 150 degrees flexion, 150 degrees scaption, and 50 degrees ER. Baseline: see above Goal status: INITIAL  4.  The patient will be able to return to modified gym routine to continue strengthening program within parameters of protocol (will be around 10 weeks at goal date). Baseline:  not doing shoulder exercises Goal status: INITIAL  PLAN: PT FREQUENCY: 1-2x/week  PT DURATION: 8 weeks  PLANNED INTERVENTIONS: 97164- PT Re-evaluation, 97110-Therapeutic exercises, 97530- Therapeutic activity, [id number]- Neuromuscular re-education, 97535- Self Care, 02859- Manual therapy, G0283- Electrical stimulation (unattended), [id number]- Electrical stimulation (manual), 97016- Vasopneumatic device, 20560 (1-2 muscles), 20561 (3+ muscles)- Dry  Needling, Patient/Family education, Taping, Joint mobilization, Cryotherapy, and Moist heat  PLAN FOR NEXT SESSION: PROGRESS TO NEXT PHASE OF PROTOCOL! progress per protocol to patient tolerance.  Progress AROM check response to isometrics; work on neuromuscular re-ed for posterior shoulder girdle progressing to disassociation of UE's while posterior shoulder girdle engaged    Darice Conine, PT,DPT12/31/20254:16 PM   "

## 2024-07-19 ENCOUNTER — Encounter: Payer: Self-pay | Admitting: Rehabilitative and Restorative Service Providers"

## 2024-07-19 ENCOUNTER — Ambulatory Visit: Attending: Family Medicine | Admitting: Rehabilitative and Restorative Service Providers"

## 2024-07-19 DIAGNOSIS — M25511 Pain in right shoulder: Secondary | ICD-10-CM | POA: Diagnosis present

## 2024-07-19 DIAGNOSIS — M6281 Muscle weakness (generalized): Secondary | ICD-10-CM | POA: Diagnosis present

## 2024-07-19 DIAGNOSIS — R293 Abnormal posture: Secondary | ICD-10-CM | POA: Diagnosis present

## 2024-07-19 NOTE — Therapy (Signed)
 " OUTPATIENT PHYSICAL THERAPY UPPER EXTREMITY TREATMENT   Patient Name: Derek Brewer MRN: [id number] DOB:05-27-1976, 49 y.o., male Today's Date: 07/19/2024  END OF SESSION:  PT End of Session - 07/19/24 1617     Visit Number 12    Number of Visits 16    Date for Recertification  07/23/24    Authorization Type BCBS 2025 ([place] YEAR)  *NO AUTH REQUIRED PER [employer]  OOP MAX HAS BEEN MET  26 VISITS FOR THE YEAR ALLOWED WITHOUT PRIOR AUTH  PER [employer]  05/23/2024  TLL    Authorization Time Period calendar year    Authorization - Visit Number 12    Authorization - Number of Visits 26    PT Start Time 1615    PT Stop Time 1700    PT Time Calculation (min) 45 min             Past Medical History:  Diagnosis Date   Cervical radiculopathy    Cyst of buttocks 01/2016   left buttock    Dental crowns present    x 2   Mixed hyperlipidemia 12/20/2018   Tobacco use    Past Surgical History:  Procedure Laterality Date   ACHILLES TENDON REPAIR Left    CYST EXCISION Left 01/24/2016   Procedure: EXCISION LEFT BUTTOCK CYST;  Surgeon: Vicenta Poli, MD;  Location: [facility];  Service: General;  Laterality: Left;   EPIDURAL BLOCK INJECTION  2020   cervical   KNEE ARTHROSCOPY WITH PATELLAR TENDON REPAIR Right    Patient Active Problem List   Diagnosis Date Noted   Rotator cuff tear, right 11/05/2023   Cyst of eyelid, right 12/25/2022   Degenerative disc disease, lumbar 02/19/2022   Perianal abscess 12/25/2020   Well adult exam 12/19/2019   Mixed hyperlipidemia 12/20/2018   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 Dozier, MD) REFERRING DIAG: ICD-10 Z98.890 Other specified postprocedural states THERAPY DIAG:  Acute pain of right shoulder  Muscle weakness (generalized)  Abnormal posture  Rationale for Evaluation and Treatment:  Rehabilitation  ONSET DATE: 05/10/24  SUBJECTIVE:                                                                                                                                                                                      SUBJECTIVE STATEMENT: Patient reports  he feels good, no pain  Eval: The patient got significant pain in the R shoulder in May and learned he had a R supraspinatus tear. He also had atrophy in the shoulder and  was found to have c-spine compression and underwent ACDF in July for his neck. He underwent R RTC on 05/10/24.  Hand dominance: Left  PERTINENT HISTORY: ACDF 01/12/24 cervical decompression surgery C3-C5, hyperlipidemia  PAIN:  Are you having pain? No  PRECAUTIONS: Shoulder-- follow protocol  WEIGHT BEARING RESTRICTIONS: Yes no loading per protocol  FALLS:  Has patient fallen in last 6 months? 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  PATIENT SURVEYS :  Quick Dash: =47.7%     SENSATION: WFL  POSTURE: Notable atrophy in R anterior and middle deltoid *has recent cervical decompression  UPPER EXTREMITY ROM:  *only within allowed ROM per protocol  Passive ROM Right eval Left eval Right 06/13/24 Right PROM 06/21/24  Shoulder flexion 90 degrees PROM WNLs  AAROM 140 154 PROM  Shoulder extension      Shoulder abduction      Shoulder adduction      Shoulder internal rotation      Shoulder external rotation To 20 degrees PROM  AAROM 35 deg   Elbow flexion Full AROM     Elbow extension Full AROM     Wrist flexion      Wrist  extension      Wrist ulnar deviation      Wrist radial deviation      Wrist pronation      Wrist supination      (Blank rows = not tested)  UPPER EXTREMITY MMT: MMT Right eval Left eval  Shoulder flexion    Shoulder extension    Shoulder abduction    Shoulder adduction    Shoulder internal rotation    Shoulder external rotation    Middle trapezius    Lower trapezius    Elbow flexion >3/5   Elbow extension >3/5   Wrist flexion >3/5   Wrist extension >3/5   Wrist ulnar deviation    Wrist radial deviation    Wrist pronation    Wrist supination    Grip strength (lbs)    (Blank rows = not tested)  [facility] Adult PT Treatment:                                                DATE: 07/20/23 Therapeutic Exercise:  Neuromuscular re-ed: Standing  Ball on wall CW/CCW x 60 sec each Bilat ER with noodle red TB x 10 Row green TB x 10  ER red TB 3 sec x 10  IR red TB 3 sec x 10  Prone  Row to neutral x 10  Shoulder extension to neutral x 10  Sidelying  ER 2 x 10  Sidelying shoulder abduction 2 x 10  Supine  Serratus punch 2 x 10 Supine diagonals 2 x 10 Rhythmic stabilization supine shoulder 90 deg flexion small range flex/ext; horizontal ab/add; circles CW/CCW x 10-15 each  Therapeutic Activity: Standing  Scaption x 10 Flexion x 10 Abduction x 10 Bent over row 2 x 10 with  cues to not go past neutral Bent over shoulder ext to netural 2 x 10   [facility] Adult PT Treatment:                                                DATE: 07/13/24 Therapeutic Exercise: Pulley flexion x 1 min, scaption x 1 min  Neuromuscular re-ed: Ball on wall CW/CCW x 60 sec each Seated bilat ER 2 x 10 Sidelying ER 2 x 10  Sidelying shoulder abduction 2 x 10  Supine serratus punch 2 x 10 Supine diagonals 2 x 10 Rhythmic stabilization supine shoulder 90 deg flexion small range flex/ext; horizontal ab/add; circles CW/CCW x 10-15 each  Serratus slide red TB x 10 Therapeutic Activity: Standing with mirror  to avoid compensations: Scaption x 10 Flexion x 10 Abduction x 10 Bent over row 2 x 10 with cues to not go past neutral Bent over shoulder ext to netural 2 x 10    [facility] Adult PT Treatment:                                                DATE: 07/11/24 Therapeutic Exercise: HEP review/update   Neuromuscular re-ed: Sidelying ER 2 x 10  Sidelying shoulder abduction 2 x 10  Prone scapular retraction 2 x 10  Supine serratus punch 2 x 10  Seated bilateral ER 2 x 10  Therapeutic Activity: Pulleys flexion and scaption x 1 minute each  Inclined shoulder flexion AROM 2 x 10  Seated shoulder abduction short lever x 10, long lever x 10 (115 degrees) Finger ladder abduction x 5     [facility] Adult PT Treatment:                                                DATE: 07/05/24 Therapeutic Exercise: Pulley flexion x 10 sec x 5, scaption x 10 sec x 5 Manual Therapy: STM R shoulder girdle  PROM Rt shoulder in all planes within tisue limits and patient tolerance Neuromuscular re-ed: Bilat ER with scap squeeze x 10 Ball on wall flexion/extension; horozontal ab/adduction; CW/CCW ~ 45-60 sec each Rhythmic stabilization supine shoulder 90 deg flexion small range flex/ext; horizontal ab/add; circles CW/CCW x 10-15 each  Therapeutic Activity: AROM bilat shoulder flexion avoiding shoulder girdle elevation x 10  AROM bilat shoulder scaption focus on scapular control x 10 AROM shoulder abduction x 10  Prone row to neutral 2 x 10 Isometric row green step back TB 3 sec x 10 x 2 Isometric ER green TB side step R 3 sec x 10 x 2 Isometric IR green TB side step L 3 sec x 10 x2 Isometric shoulder extension green TB step back 3 sec x 10 x 2  Self Care: Reviewed post op protocols for 8 weeks  Encouraged continued work on scapular control to improve shoulder control  PATIENT EDUCATION: Education  details: HEP update  Person educated: Patient Education method: Explanation, Demonstration, Tactile cues, and Verbal cues, handout  Education comprehension: verbalized understanding, returned demonstration, and needs further education  HOME EXERCISE PROGRAM: Access Code: [email or web address] URL: [email or web address] Date: 07/19/2024 Prepared by: Vara Mairena  Exercises - Circular Shoulder Pendulum with Table Support  - 2 x daily - 7 x weekly - 1 sets - 10 reps - Standing Scapular Retraction  - 2 x daily - 7 x weekly - 1 sets - 10 reps - Prone Scapular Retraction  - 1 x daily - 7 x weekly - 2 sets - 10 reps - Supine Shoulder Press AAROM in Abduction with Dowel  - 1 x daily - 7 x weekly - 2 sets - 10 reps - Seated Shoulder External Rotation AAROM with Cane and Hand in Neutral  - 1 x daily - 7 x weekly - 3 sets - 10 reps - Seated Shoulder Flexion AAROM with Dowel  - 1 x daily - 7 x weekly - 3 sets - 10 reps - Seated Shoulder Abduction AAROM with Dowel  - 1 x daily - 7 x weekly - 3 sets - 10 reps - Scaption Wall Slide with Towel  - 1 x daily - 7 x weekly - 3 sets - 10 reps - Isometric Shoulder Abduction at Wall  - 2 x daily - 7 x weekly - 1 sets - 5-10 reps - 5 sec  hold - Isometric Shoulder Flexion at Wall  - 2 x daily - 7 x weekly - 1 sets - 5-10 reps - 5 sec  hold - Supine Shoulder Rhythmic Stabilization- Horizontal Abduction/Adduction  - 1 x daily - 7 x weekly - 1 sets - 5-10 reps - Standing Shoulder Row Reactive Isometric  - 1 x daily - 7 x weekly - 1 sets - 10 reps - 30-45 sec  hold - Shoulder External Rotation Reactive Isometrics  - 1 x daily - 7 x weekly - 1 sets - 10 reps - 3-5 sec  hold - Shoulder Internal Rotation Reactive Isometrics  - 1 x daily - 7 x weekly - 1-2 sets - 10 reps - 3-5 sec  hold - Shoulder Extension Reactive Isometrics with Elbow Extended  - 1 x daily - 7 x weekly - 1-2 sets - 10 reps - 3-5 sec  hold - Shoulder External Rotation and Scapular Retraction with  Resistance  - 2 x daily - 7 x weekly - 1 sets - 10 reps - 3-5 sec  hold - Shoulder External Rotation with Anchored Resistance  - 2 x daily - 7 x weekly - 1-2 sets - 10 reps - 3 sec  hold - Shoulder Internal Rotation with Resistance  - 2 x daily - 7 x weekly - 1-2 sets - 10 reps - 3 sec  hold - Standing Bilateral Low Shoulder Row with Anchored Resistance  - 2 x daily - 7 x weekly - 1-3 sets - 10 reps - 2-3 sec  hold - Shoulder extension with resistance - Neutral  - 1 x daily - 7 x weekly - 1-2 sets - 10 reps - 3 sec  hold   ASSESSMENT:  CLINICAL IMPRESSION: Patient is 10 weeks post R RCR. He has no pain. Great ROM. Progressing with resistive exercises per protocol with no difficulty. Excellent progress with shoulder rehab.   Eval: Patient is a 49 y.o. male who was seen today for physical therapy evaluation and treatment for s/p R rotator  cuff repair. Of note, he also had atrophy around the R shoulder -- most visible today in anterior deltoid (C5-C6). He had cervical decompression surgery in July prior to shoulder surgery. He presents with no limitations from neck demonstrating  functional ROM with c-spine. Patient has been performing home AAROM to 90 deg flexion with R shoulder at instruction from referring provider. At today's evaluation, he presents with impairments of dec'd ROM, dec'd strength, atrophy of shoulder musculature R shoulder. PT to work to restore full ROM and return to lifting activities and wellness/strengthening routine.   OBJECTIVE IMPAIRMENTS: decreased ROM, decreased strength, hypomobility, increased fascial restrictions, impaired flexibility, postural dysfunction, and pain.    GOALS: Goals reviewed with patient? Yes  SHORT TERM GOALS: Target date: 06/22/24  The patient will be indep with initial HEP Baseline: initiated at eval Goal status: MET  2.  The patient will improve PROM to 120 degrees, ER to 40 degrees  Baseline:  per protcol will be able to advance in 2 more  weeks (06/07/24) to more ROM--see above Goal status: MET 06/23/24  3.  The patient will demonstrate AAROM without evidence of shoulder shrug. Baseline:  only doing PROM at this time Goal status: MET on 06/21/24  LONG TERM GOALS: Target date: 07/23/24  The patient will be indep with progression of HEP. Baseline:  initiated at eval Goal status: INITIAL  2.  The patient will improve Quick Dash by 15%  to demonstrate improved functional abilities. Baseline: see above Goal status: INITIAL  3.  The patient will have AROM R side to 150 degrees flexion, 150 degrees scaption, and 50 degrees ER. Baseline: see above Goal status: INITIAL  4.  The patient will be able to return to modified gym routine to continue strengthening program within parameters of protocol (will be around 10 weeks at goal date). Baseline:  not doing shoulder exercises Goal status: INITIAL  PLAN: PT FREQUENCY: 1-2x/week  PT DURATION: 8 weeks  PLANNED INTERVENTIONS: 97164- PT Re-evaluation, 97110-Therapeutic exercises, 97530- Therapeutic activity, [id number]- Neuromuscular re-education, 97535- Self Care, [id number]- Manual therapy, [id number]- Electrical stimulation (unattended), [id number]- Electrical stimulation (manual), 97016- Vasopneumatic device, 20560 (1-2 muscles), 20561 (3+ muscles)- Dry Needling, Patient/Family education, Taping, Joint mobilization, Cryotherapy, and Moist heat  PLAN FOR NEXT SESSION: PROGRESS TO NEXT PHASE OF PROTOCOL! progress per protocol to patient tolerance.  Progress AROM check response to isometrics; work on neuromuscular re-ed for posterior shoulder girdle progressing to disassociation of UE's while posterior shoulder girdle engaged    Cheyane Ayon P. Ina PT, MPH 07/19/2024 4:22 PM    "

## 2024-07-21 ENCOUNTER — Encounter: Payer: Self-pay | Admitting: Rehabilitative and Restorative Service Providers"

## 2024-07-21 ENCOUNTER — Ambulatory Visit: Admitting: Rehabilitative and Restorative Service Providers"

## 2024-07-21 DIAGNOSIS — M6281 Muscle weakness (generalized): Secondary | ICD-10-CM

## 2024-07-21 DIAGNOSIS — R293 Abnormal posture: Secondary | ICD-10-CM

## 2024-07-21 DIAGNOSIS — M25511 Pain in right shoulder: Secondary | ICD-10-CM

## 2024-07-21 NOTE — Therapy (Signed)
 " OUTPATIENT PHYSICAL THERAPY UPPER EXTREMITY TREATMENT   Patient Name: FILMORE MOLYNEUX MRN: [id number] DOB:June 16, 1976, 49 y.o., male Today's Date: 07/21/2024  END OF SESSION:  PT End of Session - 07/21/24 1623     Visit Number 13    Number of Visits 16    Date for Recertification  07/23/24    Authorization Type BCBS 2025 (CAL YEAR)  *NO AUTH REQUIRED PER [employer]  OOP MAX HAS BEEN MET  26 VISITS FOR THE YEAR ALLOWED WITHOUT PRIOR AUTH  PER [employer]  05/23/2024  TLL    Authorization Time Period calendar year used 2 for 2026    Authorization - Visit Number 13    Authorization - Number of Visits 26   26 for year - used 2 for 2026   PT Start Time 1615    PT Stop Time 1700    PT Time Calculation (min) 45 min    Activity Tolerance Patient tolerated treatment well             Past Medical History:  Diagnosis Date   Cervical radiculopathy    Cyst of buttocks 01/2016   left buttock    Dental crowns present    x 2   Mixed hyperlipidemia 12/20/2018   Tobacco use    Past Surgical History:  Procedure Laterality Date   ACHILLES TENDON REPAIR Left    CYST EXCISION Left 01/24/2016   Procedure: EXCISION LEFT BUTTOCK CYST;  Surgeon: Vicenta Poli, MD;  Location: [facility];  Service: General;  Laterality: Left;   EPIDURAL BLOCK INJECTION  2020   cervical   KNEE ARTHROSCOPY WITH PATELLAR TENDON REPAIR Right    Patient Active Problem List   Diagnosis Date Noted   Rotator cuff tear, right 11/05/2023   Cyst of eyelid, right 12/25/2022   Degenerative disc disease, lumbar 02/19/2022   Perianal abscess 12/25/2020   Well adult exam 12/19/2019   Mixed hyperlipidemia 12/20/2018   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 Dozier, MD) REFERRING DIAG: ICD-10 Z98.890 Other specified postprocedural states THERAPY DIAG:  Acute pain of right shoulder  Muscle  weakness (generalized)  Abnormal posture  Rationale for Evaluation and Treatment: Rehabilitation  ONSET DATE: 05/10/24  SUBJECTIVE:                                                                                                                                                                                      SUBJECTIVE STATEMENT: Patient reports some soreness following last visit. Took it easy yesterday and is doing okay  today.    Eval: The patient got significant pain in the R shoulder in May and learned he had a R supraspinatus tear. He also had atrophy in the shoulder and was found to have c-spine compression and underwent ACDF in July for his neck. He underwent R RTC on 05/10/24.  Hand dominance: Left  PERTINENT HISTORY: ACDF 01/12/24 cervical decompression surgery C3-C5, hyperlipidemia  PAIN:  Are you having pain? No  PRECAUTIONS: Shoulder-- follow protocol  WEIGHT BEARING RESTRICTIONS: Yes no loading per protocol  FALLS:  Has patient fallen in last 6 months? 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  PATIENT SURVEYS :  Quick Dash: =47.7%     SENSATION: WFL  POSTURE: Notable atrophy in R anterior and middle deltoid *has recent cervical decompression  UPPER EXTREMITY ROM:  *only within allowed ROM per protocol  Passive ROM Right eval Left eval Right 06/13/24 Right PROM 06/21/24  Shoulder flexion 90 degrees PROM WNLs  AAROM 140 154 PROM  Shoulder extension      Shoulder abduction      Shoulder adduction      Shoulder internal rotation       Shoulder external rotation To 20 degrees PROM  AAROM 35 deg   Elbow flexion Full AROM     Elbow extension Full AROM     Wrist flexion      Wrist extension      Wrist ulnar deviation      Wrist radial deviation      Wrist pronation      Wrist supination      (Blank rows = not tested)  UPPER EXTREMITY MMT: MMT Right eval Left eval  Shoulder flexion    Shoulder extension    Shoulder abduction    Shoulder adduction    Shoulder internal rotation    Shoulder external rotation    Middle trapezius    Lower trapezius    Elbow flexion >3/5   Elbow extension >3/5   Wrist flexion >3/5   Wrist extension >3/5   Wrist ulnar deviation    Wrist radial deviation    Wrist pronation    Wrist supination    Grip strength (lbs)    (Blank rows = not tested)  [facility] Adult PT Treatment:                                                DATE: 07/22/23 Therapeutic Exercise:  Standing shoulder flexion towel on wall x 10   Shoulder extension with cane 5 sec x 3  Shoulder horizontal abduction with cane to L 5 sec x 5  Shoulder IR with cane 5 sec x 5  Neuromuscular re-ed: Standing  Ball on wall CW/CCW x 60 sec each Bilat ER with noodle red TB x 10 Row green TB x 10  ER red TB 3 sec x 10  IR red TB 3 sec x 10  Prone  Row to neutral x 10  Shoulder extension to neutral x 10  Sidelying  ER 2 x 10  Sidelying shoulder abduction 2 x 10  Supine  Serratus punch x 10 Rhythmic stabilization supine shoulder 90 deg flexion small range flex/ext; horizontal ab/add; circles CW/CCW x 10-15 each  Therapeutic Activity: Standing  Scaption x 10 Flexion x 10 Abduction x 10 Bent over row 2 x 10 to neutral  Manual:   STM through anterior R shoulder in area of muscular tightness in biceps and pecs   PROM R shoulder flexion: scaption and ER in scapular plane   [facility] Adult PT Treatment:                                                DATE: 07/20/23 Therapeutic Exercise:  Neuromuscular re-ed: Standing  Ball on  wall CW/CCW x 60 sec each Bilat ER with noodle red TB x 10 Row green TB x 10  ER red TB 3 sec x 10  IR red TB 3 sec x 10  Prone  Row to neutral x 10  Shoulder extension to neutral x 10  Sidelying  ER 2 x 10  Sidelying shoulder abduction 2 x 10  Supine  Serratus punch 2 x 10 Supine diagonals 2 x 10 Rhythmic stabilization supine shoulder 90 deg flexion small range flex/ext; horizontal ab/add; circles CW/CCW x 10-15 each  Therapeutic Activity: Standing  Scaption x 10 Flexion x 10 Abduction x 10 Bent over row 2 x 10 with cues to not go past neutral Bent over shoulder ext to netural 2 x 10   [facility] Adult PT Treatment:                                                DATE: 07/13/24 Therapeutic Exercise: Pulley flexion x 1 min, scaption x 1 min  Neuromuscular re-ed: Ball on wall CW/CCW x 60 sec each Seated bilat ER 2 x 10 Sidelying ER 2 x 10  Sidelying shoulder abduction 2 x 10  Supine serratus punch 2 x 10 Supine diagonals 2 x 10 Rhythmic stabilization supine shoulder 90 deg flexion small range flex/ext; horizontal ab/add; circles CW/CCW x 10-15 each  Serratus slide red TB x 10 Therapeutic Activity: Standing with mirror to avoid compensations: Scaption x 10 Flexion x 10 Abduction x 10 Bent over row 2 x 10 with cues to not go past neutral Bent over shoulder ext to netural 2 x 10                                                                                                                   PATIENT EDUCATION: Education details: HEP update  Person educated: Patient Education method: Explanation, Demonstration, Tactile cues, and Verbal cues, handout  Education comprehension: verbalized understanding,  returned demonstration, and needs further education  HOME EXERCISE PROGRAM: Access Code: [email or web address] URL: [email or web address] Date: 07/21/2024 Prepared by: Bernadean Saling  Exercises - Circular Shoulder Pendulum with Table Support  - 2 x daily - 7 x weekly - 1 sets -  10 reps - Standing Scapular Retraction  - 2 x daily - 7 x weekly - 1 sets - 10 reps - Prone Scapular Retraction  - 1 x daily - 7 x weekly - 2 sets - 10 reps - Seated Shoulder External Rotation AAROM with Cane and Hand in Neutral  - 1 x daily - 7 x weekly - 3 sets - 10 reps - Scaption Wall Slide with Towel  - 1 x daily - 7 x weekly - 3 sets - 10 reps - Isometric Shoulder Abduction at Wall  - 2 x daily - 7 x weekly - 1 sets - 5-10 reps - 5 sec  hold - Isometric Shoulder Flexion at Wall  - 2 x daily - 7 x weekly - 1 sets - 5-10 reps - 5 sec  hold - Supine Shoulder Rhythmic Stabilization- Horizontal Abduction/Adduction  - 1 x daily - 7 x weekly - 1 sets - 5-10 reps - Standing Shoulder Row Reactive Isometric  - 1 x daily - 7 x weekly - 1 sets - 10 reps - 30-45 sec  hold - Shoulder External Rotation Reactive Isometrics  - 1 x daily - 7 x weekly - 1 sets - 10 reps - 3-5 sec  hold - Shoulder Internal Rotation Reactive Isometrics  - 1 x daily - 7 x weekly - 1-2 sets - 10 reps - 3-5 sec  hold - Shoulder Extension Reactive Isometrics with Elbow Extended  - 1 x daily - 7 x weekly - 1-2 sets - 10 reps - 3-5 sec  hold - Shoulder External Rotation and Scapular Retraction with Resistance  - 2 x daily - 7 x weekly - 1 sets - 10 reps - 3-5 sec  hold - Shoulder External Rotation with Anchored Resistance  - 2 x daily - 7 x weekly - 1-2 sets - 10 reps - 3 sec  hold - Shoulder Internal Rotation with Resistance  - 2 x daily - 7 x weekly - 1-2 sets - 10 reps - 3 sec  hold - Standing Bilateral Low Shoulder Row with Anchored Resistance  - 2 x daily - 7 x weekly - 1-3 sets - 10 reps - 2-3 sec  hold - Shoulder extension with resistance - Neutral  - 1 x daily - 7 x weekly - 1-2 sets - 10 reps - 3 sec  hold - Standing Shoulder Extension with Dowel  - 2 x daily - 7 x weekly - 1 sets - 5 reps - 5-10 sec  hold - Standing Shoulder Internal Rotation AAROM with Dowel  - 1 x daily - 7 x weekly - 1 sets - 5 reps - 5-10 sec  hold -  Standing Bilateral Shoulder Internal Rotation AAROM with Dowel  - 1 x daily - 7 x weekly - 1 sets - 5 reps - 5-10 sec  hold   ASSESSMENT:  CLINICAL IMPRESSION: Patient is 10 weeks 2 days post R RCR. No pain but did have some increased soreness R shoulder following last visit. Resolved by today. No additional exercises today. Will progress with resistive exercises per protocol.    Eval: Patient is a 49 y.o. male who was seen today for physical therapy evaluation and treatment for s/p R rotator  cuff repair. Of note, he also had atrophy around the R shoulder -- most visible today in anterior deltoid (C5-C6). He had cervical decompression surgery in July prior to shoulder surgery. He presents with no limitations from neck demonstrating  functional ROM with c-spine. Patient has been performing home AAROM to 90 deg flexion with R shoulder at instruction from referring provider. At today's evaluation, he presents with impairments of dec'd ROM, dec'd strength, atrophy of shoulder musculature R shoulder. PT to work to restore full ROM and return to lifting activities and wellness/strengthening routine.   OBJECTIVE IMPAIRMENTS: decreased ROM, decreased strength, hypomobility, increased fascial restrictions, impaired flexibility, postural dysfunction, and pain.    GOALS: Goals reviewed with patient? Yes  SHORT TERM GOALS: Target date: 06/22/24  The patient will be indep with initial HEP Baseline: initiated at eval Goal status: MET  2.  The patient will improve PROM to 120 degrees, ER to 40 degrees  Baseline:  per protcol will be able to advance in 2 more weeks (06/07/24) to more ROM--see above Goal status: MET 06/23/24  3.  The patient will demonstrate AAROM without evidence of shoulder shrug. Baseline:  only doing PROM at this time Goal status: MET on 06/21/24  LONG TERM GOALS: Target date: 07/23/24  The patient will be indep with progression of HEP. Baseline:  initiated at eval Goal status:  INITIAL  2.  The patient will improve Quick Dash by 15%  to demonstrate improved functional abilities. Baseline: see above Goal status: INITIAL  3.  The patient will have AROM R side to 150 degrees flexion, 150 degrees scaption, and 50 degrees ER. Baseline: see above Goal status: INITIAL  4.  The patient will be able to return to modified gym routine to continue strengthening program within parameters of protocol (will be around 10 weeks at goal date). Baseline:  not doing shoulder exercises Goal status: INITIAL  PLAN: PT FREQUENCY: 1-2x/week  PT DURATION: 8 weeks  PLANNED INTERVENTIONS: 97164- PT Re-evaluation, 97110-Therapeutic exercises, 97530- Therapeutic activity, [id number]- Neuromuscular re-education, 97535- Self Care, [id number]- Manual therapy, [id number]- Electrical stimulation (unattended), [id number]- Electrical stimulation (manual), 97016- Vasopneumatic device, 20560 (1-2 muscles), 20561 (3+ muscles)- Dry Needling, Patient/Family education, Taping, Joint mobilization, Cryotherapy, and Moist heat  PLAN FOR NEXT SESSION: PROGRESS TO NEXT PHASE OF PROTOCOL! progress per protocol to patient tolerance.  Progress AROM check response to isometrics; work on neuromuscular re-ed for posterior shoulder girdle progressing to disassociation of UE's while posterior shoulder girdle engaged    Meagon Duskin P. Ina PT, MPH 07/21/2024 4:25 PM    "

## 2024-07-26 ENCOUNTER — Ambulatory Visit: Admitting: Rehabilitative and Restorative Service Providers"

## 2024-07-26 ENCOUNTER — Encounter: Payer: Self-pay | Admitting: Rehabilitative and Restorative Service Providers"

## 2024-07-26 DIAGNOSIS — M25511 Pain in right shoulder: Secondary | ICD-10-CM | POA: Diagnosis not present

## 2024-07-26 DIAGNOSIS — R293 Abnormal posture: Secondary | ICD-10-CM

## 2024-07-26 DIAGNOSIS — M6281 Muscle weakness (generalized): Secondary | ICD-10-CM

## 2024-07-26 NOTE — Therapy (Signed)
 " OUTPATIENT PHYSICAL THERAPY UPPER EXTREMITY TREATMENT   Patient Name: Derek Brewer MRN: [id number] DOB:Jul 14, 1976, 49 y.o., male Today's Date: 07/26/2024  END OF SESSION:  PT End of Session - 07/26/24 1618     Visit Number 14    Number of Visits 22   Authorization Type BCBS 2025 (CAL YEAR)  *NO AUTH REQUIRED PER [employer]  OOP MAX HAS BEEN MET  26 VISITS FOR THE YEAR ALLOWED WITHOUT PRIOR AUTH  PER [facility]  05/23/2024  TLL    Authorization Time Period calendar year used 3 for 2026    Authorization - Visit Number 14    Authorization - Number of Visits 26   26 per year/3 used 2026   PT Start Time 1615    PT Stop Time 1700    PT Time Calculation (min) 45 min    Activity Tolerance Patient tolerated treatment well             Past Medical History:  Diagnosis Date   Cervical radiculopathy    Cyst of buttocks 01/2016   left buttock    Dental crowns present    x 2   Mixed hyperlipidemia 12/20/2018   Tobacco use    Past Surgical History:  Procedure Laterality Date   ACHILLES TENDON REPAIR Left    CYST EXCISION Left 01/24/2016   Procedure: EXCISION LEFT BUTTOCK CYST;  Surgeon: Vicenta Poli, MD;  Location: [facility];  Service: General;  Laterality: Left;   EPIDURAL BLOCK INJECTION  2020   cervical   KNEE ARTHROSCOPY WITH PATELLAR TENDON REPAIR Right    Patient Active Problem List   Diagnosis Date Noted   Rotator cuff tear, right 11/05/2023   Cyst of eyelid, right 12/25/2022   Degenerative disc disease, lumbar 02/19/2022   Perianal abscess 12/25/2020   Well adult exam 12/19/2019   Mixed hyperlipidemia 12/20/2018   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 Dozier, MD) REFERRING DIAG: ICD-10 Z98.890 Other specified postprocedural states THERAPY DIAG:  Acute pain of right shoulder  Muscle weakness (generalized)  Abnormal  posture  Rationale for Evaluation and Treatment: Rehabilitation  ONSET DATE: 05/10/24  SUBJECTIVE:                                                                                                                                                                                      SUBJECTIVE STATEMENT: Patient reports some soreness following last visit. Took it easy yesterday and is doing okay today.    Eval: The patient got significant pain in the  R shoulder in May and learned he had a R supraspinatus tear. He also had atrophy in the shoulder and was found to have c-spine compression and underwent ACDF in July for his neck. He underwent R RTC on 05/10/24.  Hand dominance: Left  PERTINENT HISTORY: ACDF 01/12/24 cervical decompression surgery C3-C5, hyperlipidemia  PAIN:  Are you having pain? No  PRECAUTIONS: Shoulder-- follow protocol  WEIGHT BEARING RESTRICTIONS: Yes no loading per protocol  FALLS:  Has patient fallen in last 6 months? 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  PATIENT SURVEYS :  Quick Dash: =47.7%     SENSATION: WFL  POSTURE: Notable atrophy in R anterior and middle deltoid *has recent cervical decompression  UPPER EXTREMITY ROM:  *only within allowed ROM per protocol  Passive ROM Right eval Left eval Right 06/13/24 Right PROM 06/21/24 Right  07/26/24 AROM  Shoulder flexion 90 degrees PROM WNLs  AAROM 140 154 PROM   Shoulder extension       Shoulder abduction       Shoulder adduction       Shoulder internal rotation       Shoulder  external rotation To 20 degrees PROM  AAROM 35 deg    Elbow flexion Full AROM      Elbow extension Full AROM      Wrist flexion       Wrist extension       Wrist ulnar deviation       Wrist radial deviation       Wrist pronation       Wrist supination       (Blank rows = not tested)  UPPER EXTREMITY MMT: MMT Right eval Right  07/26/24  Shoulder flexion    Shoulder extension    Shoulder abduction    Shoulder adduction    Shoulder internal rotation    Shoulder external rotation    Middle trapezius    Lower trapezius    Elbow flexion >3/5   Elbow extension >3/5   Wrist flexion >3/5   Wrist extension >3/5   Wrist ulnar deviation    Wrist radial deviation    Wrist pronation    Wrist supination    Grip strength (lbs)    (Blank rows = not tested)  [facility] Adult PT Treatment:                                                DATE: 07/27/23 Therapeutic Exercise:  Standing shoulder ball on wall for stretch x 3   Shoulder extension with cane 5 sec x 3  Shoulder horizontal abduction with cane to L 5 sec x 5  Shoulder IR with cane 5 sec x 5  Neuromuscular re-ed: Standing  Bouncing ball on wall ~ 1 min x 3  Biceps curl 3# DB x 10 x 2  Biceps curl red TB x 10 x 2 Triceps extension bent fwd 3 # DB x 10 x  2  Triceps extension double red TB x 10 x 2  Ball on wall CW/CCW x 60 sec each Bilat ER with noodle red TB x 10 x 10  Bilat W with noodle red TB x 10 x 2  Row blue TB x 10  ER red TB 3 sec x 10  IR red TB 3 sec x 10  Prone  Row to neutral 2# DB x 10; 3# DB x 10  Shoulder extension to neutral 2# DB x 10: 3# DB x 10   Horizontal abduction x 10; 2 # DB x 10   Supine  Serratus punch 2# DB x 10 x 2 Rhythmic stabilization supine shoulder 90 deg flexion small range flex/ext; horizontal ab/add; circles CW/CCW x 10-15 each 2# DB  Therapeutic Activity: Standing  Scaption 2# x 10 Flexion 2# x 10 Abduction 2# x 10 Bent over row 2 x 10 to neutral  Sidelying  ER 2# DB x 10; 3# x 10    Sidelying shoulder abduction 2# 10 x 2    Manual:   STM through anterior R shoulder in area of muscular tightness in biceps and pecs   PROM R shoulder flexion: scaption and ER in scapular plane   [facility] Adult PT Treatment:                                                DATE: 07/22/23 Therapeutic Exercise:  Standing shoulder flexion towel on wall x 10   Shoulder extension with cane 5 sec x 3  Shoulder horizontal abduction with cane to L 5 sec x 5  Shoulder IR with cane 5 sec x 5  Neuromuscular re-ed: Standing  Ball on wall CW/CCW x 60 sec each Bilat ER with noodle red TB x 10 Row green TB x 10  ER red TB 3 sec x 10  IR red TB 3 sec x 10  Prone  Row to neutral x 10  Shoulder extension to neutral x 10  Sidelying  ER 2 x 10  Sidelying shoulder abduction 2 x 10  Supine  Serratus punch x 10 Rhythmic stabilization supine shoulder 90 deg flexion small range flex/ext; horizontal ab/add; circles CW/CCW x 10-15 each  Therapeutic Activity: Standing  Scaption x 10 Flexion x 10 Abduction x 10 Bent over row 2 x 10 to neutral  Manual:   STM through anterior R shoulder in area of muscular tightness in biceps and pecs   PROM R shoulder flexion: scaption and ER in scapular plane                                                                                                                   PATIENT EDUCATION: Education details: HEP update  Person educated: Patient Education method: Explanation, Demonstration, Tactile cues, and Verbal cues, handout  Education comprehension: verbalized understanding, returned demonstration, and needs further education  HOME EXERCISE PROGRAM: Access Code: [email or web address] URL: [email or web address] Date: 07/26/2024 Prepared by: Lessa Huge  Exercises - Circular Shoulder Pendulum with Table Support  - 2 x daily - 7 x weekly - 1 sets - 10 reps - Standing Scapular Retraction  - 2 x daily - 7 x weekly - 1 sets - 10 reps - Prone Scapular Retraction   - 1 x daily - 7 x weekly - 2 sets - 10 reps - Seated Shoulder External Rotation AAROM with Cane and Hand in Neutral  - 1 x daily - 7 x weekly - 3 sets - 10 reps - Scaption Wall Slide with Towel  - 1 x daily - 7 x weekly - 3 sets - 10 reps - Isometric Shoulder Abduction at Wall  - 2 x daily - 7 x weekly - 1 sets - 5-10 reps - 5 sec  hold - Isometric Shoulder Flexion at Wall  - 2 x daily - 7 x weekly - 1 sets - 5-10 reps - 5 sec  hold - Supine Shoulder Rhythmic Stabilization- Horizontal Abduction/Adduction  - 1 x daily - 7 x weekly - 1 sets - 5-10 reps - Standing Shoulder Row Reactive Isometric  - 1 x daily - 7 x weekly - 1 sets - 10 reps - 30-45 sec  hold - Shoulder External Rotation Reactive Isometrics  - 1 x daily - 7 x weekly - 1 sets - 10 reps - 3-5 sec  hold - Shoulder Internal Rotation Reactive Isometrics  - 1 x daily - 7 x weekly - 1-2 sets - 10 reps - 3-5 sec  hold - Shoulder Extension Reactive Isometrics with Elbow Extended  - 1 x daily - 7 x weekly - 1-2 sets - 10 reps - 3-5 sec  hold - Shoulder External Rotation and Scapular Retraction with Resistance  - 2 x daily - 7 x weekly - 1 sets - 10 reps - 3-5 sec  hold - Shoulder External Rotation with Anchored Resistance  - 2 x daily - 7 x weekly - 1-2 sets - 10 reps - 3 sec  hold - Shoulder Internal Rotation with Resistance  - 2 x daily - 7 x weekly - 1-2 sets - 10 reps - 3 sec  hold - Standing Bilateral Low Shoulder Row with Anchored Resistance  - 2 x daily - 7 x weekly - 1-3 sets - 10 reps - 2-3 sec  hold - Shoulder extension with resistance - Neutral  - 1 x daily - 7 x weekly - 1-2 sets - 10 reps - 3 sec  hold - Standing Shoulder Extension with Dowel  - 2 x daily - 7 x weekly - 1 sets - 5 reps - 5-10 sec  hold - Standing Shoulder Internal Rotation AAROM with Dowel  - 1 x daily - 7 x weekly - 1 sets - 5 reps - 5-10 sec  hold - Standing Bilateral Shoulder Internal Rotation AAROM with Dowel  - 1 x daily - 7 x weekly - 1 sets - 5 reps - 5-10 sec   hold - Shoulder W - External Rotation with Resistance  - 2 x daily - 7 x weekly - 1-2 sets - 10 reps - 3 sec  hold - Standing Single Arm Elbow Flexion with Resistance  - 1 x daily - 7 x weekly - 2 sets - 10 reps - 2 sec  hold - Standing Bent Over Triceps Extension  - 1 x daily - 7 x weekly - 2  sets - 10 reps - 2 sec  hold - Standing Tricep Extensions with Resistance  - 1 x daily - 7 x weekly - 2 sets - 10 reps - 2 sec  hold - Prone Shoulder Horizontal Abduction  - 1 x daily - 7 x weekly - 2 sets - 10 reps - 2 sec  hold   ASSESSMENT:  CLINICAL IMPRESSION: Patient is 11 weeks post R RCR. No pain or soreness R shoulder. Working on print production planner for [employer]. Progressed with exercises per protocol as noted. Note to MD at next visit for appointment 08/01/24.   Eval: Patient is a 49 y.o. male who was seen today for physical therapy evaluation and treatment for s/p R rotator cuff repair. Of note, he also had atrophy around the R shoulder -- most visible today in anterior deltoid (C5-C6). He had cervical decompression surgery in July prior to shoulder surgery. He presents with no limitations from neck demonstrating  functional ROM with c-spine. Patient has been performing home AAROM to 90 deg flexion with R shoulder at instruction from referring provider. At today's evaluation, he presents with impairments of dec'd ROM, dec'd strength, atrophy of shoulder musculature R shoulder. PT to work to restore full ROM and return to lifting activities and wellness/strengthening routine.   OBJECTIVE IMPAIRMENTS: decreased ROM, decreased strength, hypomobility, increased fascial restrictions, impaired flexibility, postural dysfunction, and pain.    GOALS: Goals reviewed with patient? Yes  SHORT TERM GOALS: Target date: 06/22/24  The patient will be indep with initial HEP Baseline: initiated at eval Goal status: MET  2.  The patient will improve PROM to 120 degrees, ER to 40 degrees  Baseline:  per protcol will be  able to advance in 2 more weeks (06/07/24) to more ROM--see above Goal status: MET 06/23/24  3.  The patient will demonstrate AAROM without evidence of shoulder shrug. Baseline:  only doing PROM at this time Goal status: MET on 06/21/24  LONG TERM GOALS: Target date: 09/20/24/26  The patient will be indep with progression of HEP. Baseline:  progressing per protocol  Goal status: on going   2.  The patient will improve Quick Dash by 15%  to demonstrate improved functional abilities. Baseline: see above 1/13/26L 6.8/100; 6.8%  Goal status: met   3.  The patient will have AROM R side to 150 degrees flexion, 150 degrees scaption, and 50 degrees ER. Baseline: see above Goal status: met   4.  The patient will be able to return to modified gym routine to continue strengthening program within parameters of protocol prior to d/c  Baseline:  not doing shoulder exercises  07/26/24: progressing with resistive exercises per protocol  Goal status: on going   PLAN: PT FREQUENCY: 1-2x/week  PT DURATION: 8 weeks  PLANNED INTERVENTIONS: 97164- PT Re-evaluation, 97110-Therapeutic exercises, 97530- Therapeutic activity, 97112- Neuromuscular re-education, 97535- Self Care, [id number]- Manual therapy, [id number]- Electrical stimulation (unattended), [id number]- Electrical stimulation (manual), 97016- Vasopneumatic device, 20560 (1-2 muscles), 20561 (3+ muscles)- Dry Needling, Patient/Family education, Taping, Joint mobilization, Cryotherapy, and Moist heat  PLAN FOR NEXT SESSION: progress per protocol to patient tolerance; neuromuscular re-ed for posterior shoulder girdle progressing to disassociation of UE's while posterior shoulder girdle engaged    Laban Orourke P. Ina PT, MPH 07/26/2024 4:24 PM    "

## 2024-07-28 ENCOUNTER — Encounter: Payer: Self-pay | Admitting: Rehabilitative and Restorative Service Providers"

## 2024-07-28 ENCOUNTER — Ambulatory Visit: Admitting: Rehabilitative and Restorative Service Providers"

## 2024-07-28 DIAGNOSIS — M25511 Pain in right shoulder: Secondary | ICD-10-CM

## 2024-07-28 DIAGNOSIS — M6281 Muscle weakness (generalized): Secondary | ICD-10-CM

## 2024-07-28 DIAGNOSIS — R293 Abnormal posture: Secondary | ICD-10-CM

## 2024-07-28 NOTE — Addendum Note (Signed)
 Addended by: INA FLORENE SQUIBB on: 07/28/2024 05:23 PM   Modules accepted: Orders

## 2024-07-28 NOTE — Therapy (Signed)
 " OUTPATIENT PHYSICAL THERAPY UPPER EXTREMITY TREATMENT   Patient Name: Derek Brewer MRN: [id number] DOB:1976/02/12, 49 y.o., male Today's Date: 07/28/2024  END OF SESSION:  PT End of Session - 07/28/24 1622     Visit Number 15    Number of Visits 22    Date for Recertification  09/20/24    Authorization Type BCBS 2025 ([place] YEAR)  *NO AUTH REQUIRED PER [employer]  OOP MAX HAS BEEN MET  26 VISITS FOR THE YEAR ALLOWED WITHOUT PRIOR AUTH  PER [employer]  05/23/2024  TLL    Authorization Time Period calendar year used 3 for 2026    Authorization - Visit Number 15    Authorization - Number of Visits 26   4 of 30 used for 2026   PT Start Time 1615    PT Stop Time 1700    PT Time Calculation (min) 45 min    Activity Tolerance Patient tolerated treatment well             Past Medical History:  Diagnosis Date   Cervical radiculopathy    Cyst of buttocks 01/2016   left buttock    Dental crowns present    x 2   Mixed hyperlipidemia 12/20/2018   Tobacco use    Past Surgical History:  Procedure Laterality Date   ACHILLES TENDON REPAIR Left    CYST EXCISION Left 01/24/2016   Procedure: EXCISION LEFT BUTTOCK CYST;  Surgeon: Vicenta Poli, MD;  Location: [facility];  Service: General;  Laterality: Left;   EPIDURAL BLOCK INJECTION  2020   cervical   KNEE ARTHROSCOPY WITH PATELLAR TENDON REPAIR Right    Patient Active Problem List   Diagnosis Date Noted   Rotator cuff tear, right 11/05/2023   Cyst of eyelid, right 12/25/2022   Degenerative disc disease, lumbar 02/19/2022   Perianal abscess 12/25/2020   Well adult exam 12/19/2019   Mixed hyperlipidemia 12/20/2018   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 Dozier, MD) REFERRING DIAG: ICD-10 Z98.890 Other specified postprocedural states THERAPY DIAG:  Acute pain of right shoulder  Muscle weakness  (generalized)  Abnormal posture  Rationale for Evaluation and Treatment: Rehabilitation  ONSET DATE: 05/10/24  SUBJECTIVE:                                                                                                                                                                                      SUBJECTIVE STATEMENT: Patient reports some soreness following last visit with addition of new exercises. No pain. Doing well overall.  Pleased with progress.    Eval: The patient got significant pain in the R shoulder in May and learned he had a R supraspinatus tear. He also had atrophy in the shoulder and was found to have c-spine compression and underwent ACDF in July for his neck. He underwent R RTC on 05/10/24.  Hand dominance: Left  PERTINENT HISTORY: ACDF 01/12/24 cervical decompression surgery C3-C5, hyperlipidemia  PAIN:  Are you having pain? No  PRECAUTIONS: Shoulder-- follow protocol  WEIGHT BEARING RESTRICTIONS: Yes no loading per protocol  FALLS:  Has patient fallen in last 6 months? 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  PATIENT SURVEYS :  Quick Dash: =47.7% 07/28/24: [name]; 2.3/100; 2.3%      SENSATION: WFL  POSTURE: Notable atrophy in R anterior and middle deltoid *has recent cervical decompression  UPPER EXTREMITY ROM:  *only within allowed ROM per protocol     07/28/24 AROM in standing  Passive ROM Right eval Left eval Right 06/13/24 Right PROM 06/21/24 Right  07/28/24 AROM  Shoulder flexion 90 degrees PROM WNLs  AAROM 140 154  PROM 150  Shoulder extension       Shoulder abduction     156  Shoulder adduction       Shoulder internal rotation     Thumb to T4  Shoulder external rotation To 20 degrees PROM  AAROM 35 deg  58  Elbow flexion Full AROM      Elbow extension Full AROM      Wrist flexion       Wrist extension       Wrist ulnar deviation       Wrist radial deviation       Wrist pronation       Wrist supination       (Blank rows = not tested)  UPPER EXTREMITY MMT:    07/28/24: patient accepts sub max resistance in all planes of movement for R shoulder  MMT Right eval Right  07/28/24  Shoulder flexion    Shoulder extension    Shoulder abduction    Shoulder adduction    Shoulder internal rotation    Shoulder external rotation    Middle trapezius    Lower trapezius    Elbow flexion >3/5 WFL  Elbow extension >3/5 [facility]  Wrist flexion >3/5 [facility]  Wrist extension >3/5 WFL  Wrist ulnar deviation    Wrist radial deviation    Wrist pronation    Wrist supination    Grip strength (lbs)    (Blank rows = not tested)  [facility] Adult PT Treatment:                                                DATE: 07/29/23 Therapeutic Exercise:  Standing shoulder ball on wall for stretch x 3   Shoulder IR with strap 5 sec x 1  Neuromuscular re-ed: Standing  Bouncing ball on wall ~ 1 min x 3  Biceps curl 5# DB x 10   Biceps hammer curl 5# DB x 10 Triceps extension bent fwd 5 # DB x 10 Bent row 5# x 10  Ball on wall CW/CCW x 60 sec each Bilat ER with red TB x 10 x 10  Bilat W with red TB x 10 x 2  Bodyblade 30 sec x 3 R UE forward flexion  Prone  Row to neutral 3# DB x 10 x 2  Shoulder extension to neutral 3# DB x 10 x 2   Horizontal abduction x 10; 2 # DB x 10   Supine  Serratus punch 2# DB x 10 x 2 Rhythmic stabilization supine shoulder 90 deg flexion small range flex/ext; horizontal ab/add; circles CW/CCW x 10-15 each 3# DB  Therapeutic Activity: Standing  Scaption 3# x 10 Flexion 3# x 10 Abduction 3# x  10 Wall push up x 10; push up clap x 10  Sidelying  ER 2# DB x 10; 3# x 10   Sidelying shoulder abduction 3# 10 x 2   [facility] Adult PT Treatment:                                                DATE: 07/27/23 Therapeutic Exercise:  Standing shoulder ball on wall for stretch x 3   Shoulder extension with cane 5 sec x 3  Shoulder horizontal abduction with cane to L 5 sec x 5  Shoulder IR with cane 5 sec x 5  Neuromuscular re-ed: Standing  Bouncing ball on wall ~ 1 min x 3  Biceps curl 3# DB x 10 x 2  Biceps curl red TB x 10 x 2 Triceps extension bent fwd 3 # DB x 10 x 2  Triceps extension double red TB x 10 x 2  Ball on wall CW/CCW x 60 sec each Bilat ER with noodle red TB x 10 x 10  Bilat W with noodle red TB x 10 x 2  Row blue TB x 10  ER red TB 3 sec x 10  IR red TB 3 sec x 10  Prone  Row to neutral 2# DB x 10; 3# DB x 10  Shoulder extension to neutral 2# DB x 10: 3# DB x 10   Horizontal abduction x 10; 2 # DB x 10   Supine  Serratus punch 2# DB x 10 x 2 Rhythmic stabilization supine shoulder 90 deg flexion small range flex/ext; horizontal ab/add; circles CW/CCW x 10-15 each 2# DB  Therapeutic Activity: Standing  Scaption 2# x 10 Flexion 2# x 10 Abduction 2# x 10 Bent over row 2 x 10 to neutral  Sidelying  ER 2# DB x 10; 3# x 10   Sidelying shoulder abduction 2# 10 x 2    Manual:   STM through anterior R shoulder in area of muscular tightness in biceps and pecs   PROM R shoulder flexion: scaption and ER in scapular plane   [facility] Adult PT Treatment:                                                DATE: 07/22/23 Therapeutic Exercise:  Standing shoulder flexion towel on wall x 10   Shoulder extension with  cane 5 sec x 3  Shoulder horizontal abduction with cane to L 5 sec x 5  Shoulder IR with cane 5 sec x 5  Neuromuscular re-ed: Standing  Ball on wall CW/CCW x 60 sec each Bilat ER with noodle red TB x 10 Row green TB x 10  ER red TB 3 sec x 10  IR red TB 3 sec x 10   Prone  Row to neutral x 10  Shoulder extension to neutral x 10  Sidelying  ER 2 x 10  Sidelying shoulder abduction 2 x 10  Supine  Serratus punch x 10 Rhythmic stabilization supine shoulder 90 deg flexion small range flex/ext; horizontal ab/add; circles CW/CCW x 10-15 each  Therapeutic Activity: Standing  Scaption x 10 Flexion x 10 Abduction x 10 Bent over row 2 x 10 to neutral  Manual:   STM through anterior R shoulder in area of muscular tightness in biceps and pecs   PROM R shoulder flexion: scaption and ER in scapular plane                                                                                                                   PATIENT EDUCATION: Education details: HEP update  Person educated: Patient Education method: Explanation, Demonstration, Tactile cues, and Verbal cues, handout  Education comprehension: verbalized understanding, returned demonstration, and needs further education  HOME EXERCISE PROGRAM: Access Code: [email or web address] URL: [email or web address] Date: 07/28/2024 Prepared by: Chandler Swiderski  Exercises - Standing Scapular Retraction  - 2 x daily - 7 x weekly - 1 sets - 10 reps - Prone Scapular Retraction  - 1 x daily - 7 x weekly - 2 sets - 10 reps - Supine Shoulder Rhythmic Stabilization- Horizontal Abduction/Adduction  - 1 x daily - 7 x weekly - 1 sets - 5-10 reps - Shoulder External Rotation and Scapular Retraction with Resistance  - 2 x daily - 7 x weekly - 1 sets - 10 reps - 3-5 sec  hold - Shoulder External Rotation with Anchored Resistance  - 2 x daily - 3 x weekly - 1-2 sets - 10 reps - 3 sec  hold - Shoulder Internal Rotation with Resistance  - 2 x daily - 3 x weekly - 1-2 sets - 10 reps - 3 sec  hold - Standing Bilateral Low Shoulder Row with Anchored Resistance  - 2 x daily - 3 x weekly - 1-3 sets - 10 reps - 2-3 sec  hold - Shoulder extension with resistance - Neutral  - 1 x daily - 3 x weekly - 1-2 sets - 10 reps - 3 sec  hold -  Shoulder W - External Rotation with Resistance  - 2 x daily - 7 x weekly - 1-2 sets - 10 reps - 3 sec  hold - Standing Single Arm Elbow Flexion with Resistance  - 1 x daily - 7 x weekly - 2 sets - 10 reps - 2 sec  hold - Standing Bent Over Triceps Extension  - 1  x daily - 7 x weekly - 2 sets - 10 reps - 2 sec  hold - Standing Tricep Extensions with Resistance  - 1 x daily - 7 x weekly - 2 sets - 10 reps - 2 sec  hold - Prone Shoulder Horizontal Abduction  - 1 x daily - 7 x weekly - 2 sets - 10 reps - 2 sec  hold - Wall Push Up  - 1 x daily - 7 x weekly - 1-3 sets - 10 reps - 3 sec  hold   ASSESSMENT:  CLINICAL IMPRESSION: Patient is 11 2 days weeks post R RCR. No pain in R shoulder but does have some soreness following exercises. Working on print production planner for [employer]. Progressed with exercises per protocol as noted. Note excellent progress with ROM and strength R shoulder and functional activity with R UE. Excellent progress toward goals of rehab. See goals as noted below. Note to MD. Continue treatment to accomplish goals of rehab and achieve maximum rehab potential.   Eval: Patient is a 49 y.o. male who was seen today for physical therapy evaluation and treatment for s/p R rotator cuff repair. Of note, he also had atrophy around the R shoulder -- most visible today in anterior deltoid (C5-C6). He had cervical decompression surgery in July prior to shoulder surgery. He presents with no limitations from neck demonstrating  functional ROM with c-spine. Patient has been performing home AAROM to 90 deg flexion with R shoulder at instruction from referring provider. At today's evaluation, he presents with impairments of dec'd ROM, dec'd strength, atrophy of shoulder musculature R shoulder. PT to work to restore full ROM and return to lifting activities and wellness/strengthening routine.   OBJECTIVE IMPAIRMENTS: decreased ROM, decreased strength, hypomobility, increased fascial restrictions, impaired  flexibility, postural dysfunction, and pain.    GOALS: Goals reviewed with patient? Yes  SHORT TERM GOALS: Target date: 06/22/24  The patient will be indep with initial HEP Baseline: initiated at eval Goal status: MET  2.  The patient will improve PROM to 120 degrees, ER to 40 degrees  Baseline:  per protcol will be able to advance in 2 more weeks (06/07/24) to more ROM--see above Goal status: MET 06/23/24  3.  The patient will demonstrate AAROM without evidence of shoulder shrug. Baseline:  only doing PROM at this time Goal status: MET on 06/21/24  LONG TERM GOALS: Target date: 09/20/24  The patient will be indep with progression of HEP. Baseline:  progressing per protocol  Goal status: on going   2.  The patient will improve Quick Dash by 15%  to demonstrate improved functional abilities. Baseline: see above 1/13/26L 6.8/100; 6.8%  07/28/24: Quick Dash; 2.3/100; 2.3%  Goal status: met   3.  The patient will have AROM R side to 150 degrees flexion, 150 degrees scaption, and 50 degrees ER. Baseline: see above Goal status: met   4.  The patient will be able to return to modified gym routine to continue strengthening program within parameters of protocol at d/c Baseline:  not doing shoulder exercises 07/28/24: progressing with resistive strengthening exercises  Goal status: on going   PLAN: PT FREQUENCY: 1-2x/week  PT DURATION: 8 weeks  PLANNED INTERVENTIONS: [id number]- PT Re-evaluation, 97110-Therapeutic exercises, 97530- Therapeutic activity, 97112- Neuromuscular re-education, 97535- Self Care, [id number]- Manual therapy, [id number]- Electrical stimulation (unattended), [id number]- Electrical stimulation (manual), 97016- Vasopneumatic device, 20560 (1-2 muscles), 20561 (3+ muscles)- Dry Needling, Patient/Family education, Taping, Joint mobilization, Cryotherapy, and Moist heat  PLAN FOR NEXT  SESSION: progress per protocol to patient tolerance; neuromuscular re-ed for posterior shoulder  girdle progressing to disassociation of UE's while posterior shoulder girdle engaged    Zevin Nevares P. Ina PT, MPH 07/28/24 5:13 PM    "

## 2024-08-04 ENCOUNTER — Encounter: Payer: Self-pay | Admitting: Rehabilitative and Restorative Service Providers"

## 2024-08-04 ENCOUNTER — Ambulatory Visit: Admitting: Rehabilitative and Restorative Service Providers"

## 2024-08-04 DIAGNOSIS — M25511 Pain in right shoulder: Secondary | ICD-10-CM | POA: Diagnosis not present

## 2024-08-04 DIAGNOSIS — M6281 Muscle weakness (generalized): Secondary | ICD-10-CM

## 2024-08-04 DIAGNOSIS — R293 Abnormal posture: Secondary | ICD-10-CM

## 2024-08-04 NOTE — Therapy (Signed)
 " OUTPATIENT PHYSICAL THERAPY UPPER EXTREMITY TREATMENT   Patient Name: Derek Brewer MRN: [id number] DOB:[birth year], 49 y.o., male Today's Date: 08/04/2024  END OF SESSION:  PT End of Session - 08/04/24 1621     Visit Number 16    Number of Visits 22    Date for Recertification  09/20/24    Authorization Type BCBS 2025 (CAL YEAR)  *NO AUTH REQUIRED PER [employer]  OOP MAX HAS BEEN MET  26 VISITS FOR THE YEAR ALLOWED WITHOUT PRIOR AUTH  PER [employer]  05/23/2024  TLL    Authorization Time Period calendar year used 3 for 2026    Authorization - Visit Number 16    Authorization - Number of Visits 26   5 of 30 used for 2026   PT Start Time 1620    PT Stop Time 1700    PT Time Calculation (min) 40 min    Activity Tolerance Patient tolerated treatment well             Past Medical History:  Diagnosis Date   Cervical radiculopathy    Cyst of buttocks 01/2016   left buttock    Dental crowns present    x 2   Mixed hyperlipidemia 12/20/2018   Tobacco use    Past Surgical History:  Procedure Laterality Date   ACHILLES TENDON REPAIR Left    CYST EXCISION Left 01/24/2016   Procedure: EXCISION LEFT BUTTOCK CYST;  Surgeon: Vicenta Poli, MD;  Location: [facility];  Service: General;  Laterality: Left;   EPIDURAL BLOCK INJECTION  2020   cervical   KNEE ARTHROSCOPY WITH PATELLAR TENDON REPAIR Right    Patient Active Problem List   Diagnosis Date Noted   Rotator cuff tear, right 11/05/2023   Cyst of eyelid, right 12/25/2022   Degenerative disc disease, lumbar 02/19/2022   Perianal abscess 12/25/2020   Well adult exam 12/19/2019   Mixed hyperlipidemia 12/20/2018   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 Dozier, MD) REFERRING DIAG: ICD-10 Z98.890 Other specified postprocedural states THERAPY DIAG:  Acute pain of right shoulder  Muscle weakness  (generalized)  Abnormal posture  Rationale for Evaluation and Treatment: Rehabilitation  ONSET DATE: 05/10/24  SUBJECTIVE:                                                                                                                                                                                      SUBJECTIVE STATEMENT: Patient reports that MD was pleased with his progress. He will follow up with MD when he  finishes up with PT. No pain. Working on exercises consistently at home.     Eval: The patient got significant pain in the R shoulder in May and learned he had a R supraspinatus tear. He also had atrophy in the shoulder and was found to have c-spine compression and underwent ACDF in July for his neck. He underwent R RTC on 05/10/24.  Hand dominance: Left  PERTINENT HISTORY: ACDF 01/12/24 cervical decompression surgery C3-C5, hyperlipidemia  PAIN:  Are you having pain? No  PRECAUTIONS: Shoulder-- follow protocol  WEIGHT BEARING RESTRICTIONS: Yes no loading per protocol  FALLS:  Has patient fallen in last 6 months? 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  PATIENT SURVEYS :  Quick Dash: =47.7% 07/28/24: Derek Brewer; 2.3/100; 2.3%      SENSATION: WFL  POSTURE: Notable atrophy in R anterior and middle deltoid *has recent cervical decompression  UPPER EXTREMITY ROM:  *only within allowed ROM per protocol     07/28/24 AROM in standing  Passive ROM Right eval Left eval Right 06/13/24 Right PROM 06/21/24 Right  07/28/24 AROM  Shoulder flexion 90  degrees PROM WNLs  AAROM 140 154 PROM 150  Shoulder extension       Shoulder abduction     156  Shoulder adduction       Shoulder internal rotation     Thumb to T4  Shoulder external rotation To 20 degrees PROM  AAROM 35 deg  58  Elbow flexion Full AROM      Elbow extension Full AROM      Wrist flexion       Wrist extension       Wrist ulnar deviation       Wrist radial deviation       Wrist pronation       Wrist supination       (Blank rows = not tested)  UPPER EXTREMITY MMT:    07/28/24: patient accepts sub max resistance in all planes of movement for R shoulder  MMT Right eval Right  07/28/24  Shoulder flexion    Shoulder extension    Shoulder abduction    Shoulder adduction    Shoulder internal rotation    Shoulder external rotation    Middle trapezius    Lower trapezius    Elbow flexion >3/5 [facility]  Elbow extension >3/5 [facility]  Wrist flexion >3/5 [facility]  Wrist extension >3/5 WFL  Wrist ulnar deviation    Wrist radial deviation    Wrist pronation    Wrist supination    Grip strength (lbs)    (Blank rows = not tested)  [facility] Adult PT Treatment:                                                DATE: 08/05/23 Therapeutic Exercise:  Standing shoulder flexion stretch stepping through doorway ~ 20-30 sec x 3   Corner stretch 3 positions 20-30 sec x 1  Neuromuscular  re-ed: Standing  Bouncing ball on wall overhead ~ 1 min x 3  Ball on wall PT providing perturbation   Row blue TB x 10  Bow and arrow blue TB x 10 R/L  Antirotation blue TB x 10 R/L  High row blue TB x 10  High row to ER to overhead red TB x 10  Therapeutic Activity: Standing  Counter plank 30 sec x 3  Bodyblade 60 sec x 3 R; 30 sec x 3 L UE forward flexion  Bodyblade chest to overhead bilat 60 sec x 3   DATE: 07/29/23 Therapeutic Exercise:           Standing shoulder ball on wall for stretch x 3            Shoulder IR with strap 5 sec x 1  Neuromuscular re-ed: Standing  Bouncing ball on wall ~ 1 min x 3   Biceps curl 5# DB x 10   Biceps hammer curl 5# DB x 10 Triceps extension bent fwd 5 # DB x 10 Bent row 5# x 10  Ball on wall CW/CCW x 60 sec each Bilat ER with red TB x 10 x 10  Bilat W with red TB x 10 x 2  Bodyblade 30 sec x 3 R UE forward flexion  Prone  Row to neutral 3# DB x 10 x 2  Shoulder extension to neutral 3# DB x 10 x 2   Horizontal abduction x 10; 2 # DB x 10    Supine  Serratus punch 2# DB x 10 x 2 Rhythmic stabilization supine shoulder 90 deg flexion small range flex/ext; horizontal ab/add; circles CW/CCW x 10-15 each 3# DB  Therapeutic Activity: Standing  Scaption 3# x 10 Flexion 3# x 10 Abduction 3# x 10 Wall push up x 10; push up clap x 10  Sidelying  ER 2# DB x 10; 3# x 10   Sidelying shoulder abduction 3# 10 x 2    [facility] Adult PT Treatment:                                                DATE: 07/27/23 Therapeutic Exercise:  Standing shoulder ball on wall for stretch x 3   Shoulder extension with cane 5 sec x 3  Shoulder horizontal abduction with cane to L 5 sec x 5  Shoulder IR with cane 5 sec x 5  Neuromuscular re-ed: Standing   Bouncing ball on wall ~ 1 min x 3  Biceps curl 3# DB x 10 x 2  Biceps curl red TB x 10 x 2 Triceps extension bent fwd 3 # DB x 10 x 2  Triceps extension double red TB x 10 x 2  Ball on wall CW/CCW x 60 sec each Bilat ER with noodle red TB x 10 x 10  Bilat W with noodle red TB x 10 x 2  Row blue TB x 10  ER red TB 3 sec x 10  IR red TB 3 sec x 10  Prone  Row to neutral 2# DB x 10; 3# DB x 10  Shoulder extension to neutral 2# DB x 10: 3# DB x 10   Horizontal abduction x 10; 2 # DB x 10   Supine  Serratus punch 2# DB x 10 x 2 Rhythmic stabilization supine shoulder 90 deg flexion small  range flex/ext; horizontal ab/add; circles CW/CCW x 10-15 each 2# DB  Therapeutic Activity: Standing  Scaption 2# x 10 Flexion 2# x 10 Abduction 2# x 10 Bent over row 2 x 10 to neutral  Sidelying  ER 2# DB x 10; 3# x 10    Sidelying shoulder abduction 2# 10 x 2    Manual:   STM through anterior R shoulder in area of muscular tightness in biceps and pecs   PROM R shoulder flexion: scaption and ER in scapular plane                                                                                                                   PATIENT EDUCATION: Education details: HEP update  Person educated: Patient Education method: Explanation, Demonstration, Tactile cues, and Verbal cues, handout  Education comprehension: verbalized understanding, returned demonstration, and needs further education  HOME EXERCISE PROGRAM: Access Code: [email or web address] URL: [email or web address] Date: 08/04/2024 Prepared by: Cuinn Westerhold  Exercises - Standing Scapular Retraction  - 2 x daily - 7 x weekly - 1 sets - 10 reps - Prone Scapular Retraction  - 1 x daily - 7 x weekly - 2 sets - 10 reps - Supine Shoulder Rhythmic Stabilization- Horizontal Abduction/Adduction  - 1 x daily - 7 x weekly - 1 sets - 5-10 reps - Shoulder External Rotation and Scapular Retraction with Resistance  - 2 x daily - 7 x weekly - 1 sets - 10 reps - 3-5 sec  hold - Shoulder External Rotation with Anchored Resistance  - 2 x daily - 3 x weekly - 1-2 sets - 10 reps - 3 sec  hold - Shoulder Internal Rotation with Resistance  - 2 x daily - 3 x weekly - 1-2 sets - 10 reps - 3 sec  hold - Standing Bilateral Low Shoulder Row with Anchored Resistance  - 2 x daily - 3 x weekly - 1-3 sets - 10 reps - 2-3 sec  hold - Shoulder extension with resistance - Neutral  - 1 x daily - 3 x weekly - 1-2 sets - 10 reps - 3 sec  hold - Shoulder W - External Rotation with Resistance  - 2 x daily - 7 x weekly - 1-2 sets - 10 reps - 3 sec  hold - Standing Single Arm Elbow Flexion with Resistance  - 1 x daily - 7 x weekly - 2 sets - 10 reps - 2 sec  hold - Standing Bent Over Triceps Extension  - 1 x daily - 7 x weekly - 2 sets - 10 reps - 2 sec  hold - Standing Tricep Extensions  with Resistance  - 1 x daily - 7 x weekly - 2 sets - 10 reps - 2 sec  hold - Prone Shoulder Horizontal Abduction  - 1 x daily - 7 x weekly - 2 sets - 10 reps - 2 sec  hold - Wall Push Up  - 1 x daily - 7 x weekly - 1-3 sets -  10 reps - 3 sec  hold - Drawing Bow  - 1 x daily - 3 x weekly - 1 sets - 10 reps - 3 sec  hold - Squatting High Shoulder Row with Resistance  - 1 x daily - 3 x weekly - 1 sets - 3 reps - 30 sec  hold - Standing Lat Pull Down with Resistance - Elbows Bent  - 2 x daily - 3 x weekly - 1 sets - 10 reps - 3 sec  hold - Shoulder External Rotation in Abduction with Anchored Resistance  - 1 x daily - 3 x weekly - 1-3 sets - 10 reps - 3 sec  hold - Anti-Rotation Lateral Stepping with Press  - 2 x daily - 3 x weekly - 1-2 sets - 10 reps - 2-3 sec  hold - Plank on Counter  - 1 x daily - 3 x weekly - 1 sets - 3 reps - 30-60 sec  hold - Standing shoulder flexion wall slides  - 2 x daily - 3 x weekly - 1 sets - 2-3 reps - 10 sec  hold   ASSESSMENT:  CLINICAL IMPRESSION: Patient is 12 weeks 2 days weeks post R RCR. He was seen by surgeon last week and he is pleased with his progress. No pain in R shoulder with exercises. Working on strengthening for R UE progressing exercises to include higher level exercises and activities. Patient tolerated exercises well. Will continue with progressive strengthening 1x/wk for ~ 4 weeks.   Eval: Patient is a 49 y.o. male who was seen today for physical therapy evaluation and treatment for s/p R rotator cuff repair. Of note, he also had atrophy around the R shoulder -- most visible today in anterior deltoid (C5-C6). He had cervical decompression surgery in July prior to shoulder surgery. He presents with no limitations from neck demonstrating  functional ROM with c-spine. Patient has been performing home AAROM to 90 deg flexion with R shoulder at instruction from referring provider. At today's evaluation, he presents with impairments of dec'd ROM, dec'd  strength, atrophy of shoulder musculature R shoulder. PT to work to restore full ROM and return to lifting activities and wellness/strengthening routine.   OBJECTIVE IMPAIRMENTS: decreased ROM, decreased strength, hypomobility, increased fascial restrictions, impaired flexibility, postural dysfunction, and pain.    GOALS: Goals reviewed with patient? Yes  SHORT TERM GOALS: Target date: 06/22/24  The patient will be indep with initial HEP Baseline: initiated at eval Goal status: MET  2.  The patient will improve PROM to 120 degrees, ER to 40 degrees  Baseline:  per protcol will be able to advance in 2 more weeks (06/07/24) to more ROM--see above Goal status: MET 06/23/24  3.  The patient will demonstrate AAROM without evidence of shoulder shrug. Baseline:  only doing PROM at this time Goal status: MET on 06/21/24  LONG TERM GOALS: Target date: 09/20/24  The patient will be indep with progression of HEP. Baseline:  progressing per protocol  Goal status: on going   2.  The patient will improve Quick Dash by 15%  to demonstrate improved functional abilities. Baseline: see above 1/13/26L 6.8/100; 6.8%  07/28/24: Quick Dash; 2.3/100; 2.3%  Goal status: met   3.  The patient will have AROM R side to 150 degrees flexion, 150 degrees scaption, and 50 degrees ER. Baseline: see above Goal status: met   4.  The patient will be able to return to modified gym routine to continue strengthening program within  parameters of protocol at d/c Baseline:  not doing shoulder exercises 07/28/24: progressing with resistive strengthening exercises  Goal status: on going   PLAN: PT FREQUENCY: 1-2x/week  PT DURATION: 8 weeks  PLANNED INTERVENTIONS: [id number]- PT Re-evaluation, 97110-Therapeutic exercises, 97530- Therapeutic activity, 97112- Neuromuscular re-education, 97535- Self Care, [id number]- Manual therapy, [id number]- Electrical stimulation (unattended), [id number]- Electrical stimulation (manual), 97016-  Vasopneumatic device, [id number] (1-2 muscles), 20561 (3+ muscles)- Dry Needling, Patient/Family education, Taping, Joint mobilization, Cryotherapy, and Moist heat  PLAN FOR NEXT SESSION: progress per protocol to patient tolerance; neuromuscular re-ed for posterior shoulder girdle progressing to disassociation of UE's while posterior shoulder girdle engaged    Conlin Brahm P. Ina PT, MPH 08/04/24 4:23 PM    "

## 2024-08-09 ENCOUNTER — Ambulatory Visit: Admitting: Rehabilitative and Restorative Service Providers"

## 2024-08-11 ENCOUNTER — Ambulatory Visit: Admitting: Rehabilitative and Restorative Service Providers"

## 2024-08-16 ENCOUNTER — Ambulatory Visit

## 2024-08-16 DIAGNOSIS — M6281 Muscle weakness (generalized): Secondary | ICD-10-CM

## 2024-08-16 DIAGNOSIS — M25511 Pain in right shoulder: Secondary | ICD-10-CM

## 2024-08-16 DIAGNOSIS — R293 Abnormal posture: Secondary | ICD-10-CM

## 2024-08-18 ENCOUNTER — Ambulatory Visit: Admitting: Rehabilitative and Restorative Service Providers"

## 2024-08-23 ENCOUNTER — Ambulatory Visit: Admitting: Rehabilitative and Restorative Service Providers"

## 2024-08-24 ENCOUNTER — Ambulatory Visit: Admitting: Physical Therapy

## 2024-08-30 ENCOUNTER — Ambulatory Visit: Admitting: Rehabilitative and Restorative Service Providers"

## 2024-09-06 ENCOUNTER — Ambulatory Visit: Admitting: Rehabilitative and Restorative Service Providers"

## 2024-09-13 ENCOUNTER — Ambulatory Visit: Admitting: Rehabilitative and Restorative Service Providers"

## 2025-01-02 ENCOUNTER — Encounter: Admitting: Family Medicine
